# Patient Record
Sex: Male | Born: 1975
Health system: Southern US, Community
[De-identification: ages and names within clinical notes are randomized; demographics above are authoritative.]

## PROBLEM LIST (undated history)

## (undated) DIAGNOSIS — K649 Unspecified hemorrhoids: Secondary | ICD-10-CM

## (undated) DIAGNOSIS — K921 Melena: Secondary | ICD-10-CM

## (undated) HISTORY — DX: Melena: K92.1

## (undated) HISTORY — DX: Unspecified hemorrhoids: K64.9

## (undated) HISTORY — PX: OTHER SURGICAL HISTORY: SHX169

## (undated) HISTORY — PX: WISDOM TOOTH EXTRACTION: SHX21

---

## 2017-11-29 DIAGNOSIS — Z0000 Encounter for general adult medical examination without abnormal findings: Secondary | ICD-10-CM | POA: Diagnosis not present

## 2017-11-29 DIAGNOSIS — Z125 Encounter for screening for malignant neoplasm of prostate: Secondary | ICD-10-CM | POA: Diagnosis not present

## 2017-12-06 DIAGNOSIS — Z0000 Encounter for general adult medical examination without abnormal findings: Secondary | ICD-10-CM | POA: Diagnosis not present

## 2018-12-04 DIAGNOSIS — Z0000 Encounter for general adult medical examination without abnormal findings: Secondary | ICD-10-CM | POA: Diagnosis not present

## 2018-12-11 DIAGNOSIS — Z23 Encounter for immunization: Secondary | ICD-10-CM | POA: Diagnosis not present

## 2018-12-11 DIAGNOSIS — Z0000 Encounter for general adult medical examination without abnormal findings: Secondary | ICD-10-CM | POA: Diagnosis not present

## 2020-03-19 ENCOUNTER — Encounter: Payer: Self-pay | Admitting: Nurse Practitioner

## 2020-03-24 ENCOUNTER — Ambulatory Visit (INDEPENDENT_AMBULATORY_CARE_PROVIDER_SITE_OTHER): Payer: No Typology Code available for payment source | Admitting: Pharmacist

## 2020-03-24 ENCOUNTER — Other Ambulatory Visit: Payer: Self-pay

## 2020-03-24 ENCOUNTER — Telehealth: Payer: Self-pay | Admitting: Pharmacy Technician

## 2020-03-24 DIAGNOSIS — Z7252 High risk homosexual behavior: Secondary | ICD-10-CM | POA: Diagnosis not present

## 2020-03-24 NOTE — Telephone Encounter (Signed)
[facility] Patient Product/process development scientist completed.    The patient is insured through [employer]  and has a $40 copay.  We will continue to follow to see if copay assistance is needed.  Netty Starring. Dimas Aguas CPhT Specialty Pharmacy Patient [facility] Phone: [phone] Fax:  [phone]

## 2020-03-24 NOTE — Progress Notes (Signed)
Date:  03/24/2020   HPI: Nathan Torres is a 44 y.o. male who presents to the [facility] pharmacy clinic to discuss and initiate PrEP.  Insured   [x]    Uninsured  []    There are no problems to display for this patient.   Patient's Medications   No medications on file    Allergies: Not on File  Past Medical History: No past medical history on file.  Social History: Social History   Socioeconomic History   Marital status: Married    Spouse name: Not on file   Number of children: Not on file   Years of education: Not on file   Highest education level: Not on file  Occupational History   Not on file  Tobacco Use   Smoking status: Not on file  Substance and Sexual Activity   Alcohol use: Not on file   Drug use: Not on file   Sexual activity: Not on file  Other Topics Concern   Not on file  Social History Narrative   Not on file   Social Determinants of Health   Financial Resource Strain:    Difficulty of Paying Living Expenses:   Food Insecurity:    Worried About Running Out of Food in the Last Year:    of Food in the Last Year:   Transportation Needs:    (Medical):    Lack of Transportation (Non-Medical):   Physical Activity:    Days of Exercise per Week:    Minutes of Exercise per Session:   Stress:    Feeling of Stress :   Social Connections:    Frequency of Communication with Friends and Family:    Frequency of Social Gatherings with Friends and Family:    Attends Religious Services:    Active Member of Clubs or Organizations:    Attends Merchant navy officer Meetings:    Marital Status:     No flowsheet data found.  Labs:  SCr: No results found for: CREATININE HIV No results found for: HIV Hepatitis B No results found for: HEPBSAB, HEPBSAG, HEPBCAB Hepatitis C No results found for: HEPCAB, HCVRNAPCRQN Hepatitis A No results found for: HAV RPR and STI No results found for: LABRPR,  RPRTITER  No flowsheet data found.  Assessment: Nathan Torres is here today to discuss and initiate PrEP. He knows quite a bit at baseline regarding PrEP and tried to get his PrEP online through a service called [facility] but had issues with the pharmacy and doctor and never got his medication.  He is insured through [employer] and is an Dimas Aguas for a [place].   He has male partners and has had 3 in the last 3 months with about 75% condom use. He is mainly the insertive partner, but engages in oral and rectal sex occasionally. He had labs drawn back in June for the [facility] site and was HIV negative, Hepatitis B surface antigen negative, Hepatitis C negative, syphilis negative, but positive for rectal chlamydia. He states this was the first time he has ever been tested. He was appropriately treated with azithromycin.  His urethral and pharyngeal swabs were negative for gonorrhea and chlamydia. His kidney function was also normal on these labs at 1.06 (labs are located under the media tab). He is not sure if any of his partners are HIV positive. He has never used PEP, IV drugs, or stimulants. He is interested in starting Descovy.   Counseled patient that Descovy is a  one pill once daily regimen with or without food that can prevent HIV. Discussed the importance of taking the medication daily to provide protection and decreased adherence is associated with decreased efficacy. Also discussed how Descovy works to prevent HIV but not other STDs and encouraged the use of condoms. Counseled on what to do if dose is missed, if closer to missed dose take immediately, if closer to next dose then skip and resume normal schedule.  Counseled patient that Descovy is normally well tolerated, however some patients experience a "start up syndrome" with nausea, diarrhea, dizziness, and fatigue but that those should resolve soon after starting.  Advised that any nausea can be mitigated by taking it with food. I reviewed  patient medications and found no drug interactions. Discussed how our PrEP process works here at the clinic including follow ups and lab monitoring every 3 months.  Will check a few more labs today - HIV again, Hepatitis B surface antibody, and Hepatitis A antibody to see if he has immunity. Will start him on Descovy if he is HIV negative and see him back in 3 months.  Plan: - HIV antibody, Hepatitis B surf ab, Hepatitis A ab - Descovy x 3 months if HIV negative - F/u in 3 months  Nathan Torres Nathan Torres, PharmD, BCIDP, AAHIVP, CPP Clinical Pharmacist Practitioner Infectious Diseases Clinical Pharmacist [facility] 03/24/2020, 11:37 AM

## 2020-03-25 ENCOUNTER — Telehealth: Payer: Self-pay | Admitting: Pharmacist

## 2020-03-25 ENCOUNTER — Telehealth: Payer: Self-pay | Admitting: Pharmacy Technician

## 2020-03-25 DIAGNOSIS — Z7252 High risk homosexual behavior: Secondary | ICD-10-CM

## 2020-03-25 LAB — HEPATITIS B SURFACE ANTIBODY,QUALITATIVE: Hep B S Ab: NONREACTIVE

## 2020-03-25 LAB — HEPATITIS A ANTIBODY, TOTAL: Hepatitis A AB,Total: NONREACTIVE

## 2020-03-25 LAB — HIV ANTIBODY (ROUTINE TESTING W REFLEX): HIV 1&2 Ab, 4th Generation: NONREACTIVE

## 2020-03-25 MED ORDER — DESCOVY 200-25 MG PO TABS: 1.0000 | ORAL_TABLET | Freq: Every day | ORAL | 2 refills | Status: DC

## 2020-03-25 MED FILL — DESCOVY 200-25 MG TABS: 200-25 | 30 days supply | Qty: 30 | Fill #0

## 2020-03-25 NOTE — Telephone Encounter (Signed)
[facility] Patient Advocate Encounter   Was successful in obtaining a [employer] copay card for Descovy.  This copay card will make the patients copay $0.   The billing information is as follows and has been shared with [facility].   Netty Starring. Dimas Aguas CPhT Specialty Pharmacy Patient [facility] Phone: [phone] Fax:  [phone]

## 2020-03-25 NOTE — Telephone Encounter (Signed)
Patient's HIV antibody is negative.  Will send in 3 months of Descovy to [facility].  

## 2020-04-07 ENCOUNTER — Other Ambulatory Visit: Payer: Self-pay | Admitting: Pharmacist

## 2020-04-07 DIAGNOSIS — Z7252 High risk homosexual behavior: Secondary | ICD-10-CM

## 2020-04-07 MED ORDER — DESCOVY 200-25 MG PO TABS: 1.0000 | ORAL_TABLET | Freq: Every day | ORAL | 2 refills | Status: DC

## 2020-04-07 NOTE — Progress Notes (Signed)
Patient's insurance requires Descovy to be filled at [facility]. Resending Rx now. Kathie Rhodes will call patient and coordinate.

## 2020-05-05 ENCOUNTER — Encounter: Payer: Self-pay | Admitting: General Surgery

## 2020-05-06 ENCOUNTER — Ambulatory Visit: Payer: No Typology Code available for payment source | Admitting: Nurse Practitioner

## 2020-05-06 ENCOUNTER — Encounter: Payer: Self-pay | Admitting: Nurse Practitioner

## 2020-05-06 VITALS — BP 106/78 | HR 71 | Ht 66.0 in | Wt 185.0 lb

## 2020-05-06 DIAGNOSIS — K5900 Constipation, unspecified: Secondary | ICD-10-CM | POA: Diagnosis not present

## 2020-05-06 DIAGNOSIS — K625 Hemorrhage of anus and rectum: Secondary | ICD-10-CM | POA: Diagnosis not present

## 2020-05-06 MED ORDER — SUPREP BOWEL PREP KIT 17.5-3.13-1.6 GM/177ML PO SOLN
1.0000 | ORAL | 0 refills | Status: DC
Start: 2020-05-06 — End: 2020-06-27

## 2020-05-06 NOTE — Progress Notes (Signed)
ASSESSMENT AND PLAN    # Chronic intermittent rectal bleeding / rectal discomfort with BM in setting of hard stools --Known history of hemorrhoids. Bleeding probably hemorrhoidal. Fissure not appreciated on Surgery's rectal exam/anoscopy  --Symptoms resolving after addition of fiber. Continue daily Benefiber --Water intake goal is 48-64 oz daily --Patient needs colonoscopy for further evaluation of recent rectal bleeding. the risks and benefits of colonoscopy with possible polypectomy / biopsies were discussed and the patient agrees to proceed.   # GERD --Manifested as chest discomfort / nausea ~ once a week.  --Anti-reflux measures discussed --Continue Omeprazole OTC prn  HISTORY OF PRESENT ILLNESS     Primary Gastroenterologist : new ( Dr. Adela Lank) Chief Complaint : rectal bleeding  Nathan Torres is a 44 y.o. male with PMH / PSH significant for,  but not necessarily limited to GERD  Patient is referred by Marin Olp, MD for evaluation of rectal bleeding. PCP referred patient to Surgery for evaluation of a hemorrhoid. Patient has been having intermittent rectal bleeding for 6-7 months. He had some instances of rectal bleeding as a child no further problems until now. Patient is homosexual but hasn't changed any of his sexual practices recently. Blood is bright red, occurs with BMs. He typically has associated rectal pain with the bleeding.   [facility] records reviewed.  On rectal exam there patient had small decompressed external hemorrhoids, DRE was normal.  Anoscopy demonstrated no evidence of ulcerations or granulation tissue. There were small internal hemorrhoids seen. No fissures.   Prior to starting fiber patient's  stools tended to be hard. Now stools not as hard and the rectal pain has resolved. He hasn't hasn't had any bleeding in two weeks. He tries to drink 48 ounces water daily. No abdominal pain. No unusual weight loss. Appetite is fine. About once a week he  has postprandial nausea and chest discomfort. Symptoms are always relieved with Omeprazole.     Past Medical History:  Diagnosis Date  . Bloody stools    Per [facility] encounter 03/13/2020  . Hemorrhoids    Per [facility] encounter 03/13/2020     Past Surgical History:  Procedure Laterality Date  . vocal cords    . WISDOM TOOTH EXTRACTION     Family History  Problem Relation Age of Onset  . Heart disease Mother   . Hypertension Mother   . Thyroid disease Mother   . Hypertension Father   . Hypertension Brother   . Stomach cancer Neg Hx   . Colon cancer Neg Hx   . Esophageal cancer Neg Hx   . Pancreatic cancer Neg Hx    Social History   Tobacco Use  . Smoking status: Never Smoker  . Smokeless tobacco: Never Used  Vaping Use  . Vaping Use: Never used  Substance Use Topics  . Alcohol use: Yes    Comment: occasional  . Drug use: Never   Current Outpatient Medications  Medication Sig Dispense Refill  . Multiple Vitamin (MULTIVITAMIN) tablet Take 1 tablet by mouth daily.    . Wheat Dextrin (BENEFIBER PO) Take by mouth.    Marland Kitchen emtricitabine-tenofovir AF (DESCOVY) 200-25 MG tablet Take 1 tablet by mouth daily. 30 tablet 2   No current facility-administered medications for this visit.   No Known Allergies   Review of Systems:  All systems reviewed and negative except where noted in HPI.   PHYSICAL EXAM :    Wt Readings from Last 3 Encounters:  05/06/20 185 lb (83.9  kg)    BP 106/78   Pulse 71   Ht 5\' 6"  (1.676 m)   Wt 185 lb (83.9 kg)   SpO2 99%   BMI 29.86 kg/m  Constitutional:  Pleasant male in no acute distress. Psychiatric: Normal mood and affect. Behavior is normal. EENT: Pupils normal.  Conjunctivae are normal. No scleral icterus. Neck supple.  Cardiovascular: Normal rate, regular rhythm. No edema Pulmonary/chest: Effort normal and breath sounds normal. No wheezing, rales or rhonchi. Abdominal: Soft, nondistended, nontender. Bowel sounds active throughout. There  are no masses palpable. No hepatomegaly. Neurological: Alert and oriented to person place and time. Skin: Skin is warm and dry. No rashes noted.  , NP  05/06/2020, 8:38 AM  Cc:  Referring Provider 05/08/2020, MD

## 2020-05-06 NOTE — Patient Instructions (Signed)
If you are age 46 or older, your body mass index should be between 23-30. Your Body mass index is 29.86 kg/m. If this is out of the aforementioned range listed, please consider follow up with your Primary Care Provider.  If you are age 63 or younger, your body mass index should be between 19-25. Your Body mass index is 29.86 kg/m. If this is out of the aformentioned range listed, please consider follow up with your Primary Care Provider.    We have sent the following medications to your pharmacy for you to pick up at your convenience: Suprep  Due to recent changes in healthcare laws, you may see the results of your imaging and laboratory studies on MyChart before your provider has had a chance to review them.  We understand that in some cases there may be results that are confusing or concerning to you. Not all laboratory results come back in the same time frame and the provider may be waiting for multiple results in order to interpret others.  Please give [place] 48 hours in order for your provider to thoroughly review all the results before contacting the office for clarification of your results.

## 2020-05-07 NOTE — Progress Notes (Signed)
Agree with assessment and plan as outlined.  

## 2020-05-12 ENCOUNTER — Telehealth: Payer: Self-pay | Admitting: Nurse Practitioner

## 2020-05-12 MED ORDER — SUTAB 1479-225-188 MG PO TABS: 1.0000 | ORAL_TABLET | Freq: Once | ORAL | 0 refills | Status: AC

## 2020-05-12 MED FILL — SUTAB 1479-225-188 MG TABS: 1479-225-18 | 2 days supply | Qty: 24 | Fill #0

## 2020-05-12 NOTE — Telephone Encounter (Signed)
Contacted [facility] [employer] and spoke with Arlys John, he stated that suprep did need a letter of medical necessity or that his insurance will pay for Clenpiq or Peg [id number]. Dr Adela Lank would rather the patient have Sutab per Jan so send in the [place] and code for the patient.

## 2020-05-12 NOTE — Telephone Encounter (Signed)
Patient called states Suprep requires prior auth please advise

## 2020-05-12 NOTE — Telephone Encounter (Signed)
Sent new rx to [facility] for [employer]. Sent Sutab instructions to patient via [employer]

## 2020-06-02 NOTE — Progress Notes (Signed)
Date:  06/02/2020   HPI: Nathan Torres is a 44 y.o. male who presents to the [place] clinic for HIV PrEP follow-up.  Insured   '[x]'    Uninsured  '[]'    There are no problems to display for this patient.  Patient's Medications  New Prescriptions   No medications on file  Previous Medications   EMTRICITABINE-TENOFOVIR AF (DESCOVY) 200-25 MG TABLET    Take 1 tablet by mouth daily.   MULTIPLE VITAMIN (MULTIVITAMIN) TABLET    Take 1 tablet by mouth daily.   SUPREP BOWEL PREP KIT 17.5-3.13-1.6 GM/177ML SOLN    Take 1 kit by mouth as directed. For colonoscopy prep   WHEAT DEXTRIN (BENEFIBER PO)    Take by mouth.  Modified Medications   No medications on file  Discontinued Medications   No medications on file    Allergies: No Known Allergies  Past Medical History: Past Medical History:  Diagnosis Date  . Bloody stools    Per [facility] encounter 03/13/2020  . Hemorrhoids    Per [facility] encounter 03/13/2020    Social History: Social History   Socioeconomic History  . Marital status: Unknown    Spouse name: Not on file  . Number of children: Not on file  . Years of education: Not on file  . Highest education level: Not on file  Occupational History  . Occupation: life Chief Executive Officer: [employer]  Tobacco Use  . Smoking status: Never Smoker  . Smokeless tobacco: Never Used  Vaping Use  . Vaping Use: Never used  Substance and Sexual Activity  . Alcohol use: Yes    Comment: occasional  . Drug use: Never  . Sexual activity: Not on file  Other Topics Concern  . Not on file  Social History Narrative  . Not on file   Social Determinants of Health   Financial Resource Strain:   . Difficulty of Paying Living Expenses: Not on file  Food Insecurity:   . Worried About Charity fundraiser in the Last Year: Not on file  . Ran Out of Food in the Last Year: Not on file  Transportation Needs:   . Lack of Transportation (Medical): Not on file  .  Lack of Transportation (Non-Medical): Not on file  Physical Activity:   . Days of Exercise per Week: Not on file  . Minutes of Exercise per Session: Not on file  Stress:   . Feeling of Stress : Not on file  Social Connections:   . Frequency of Communication with Friends and Family: Not on file  . Frequency of Social Gatherings with Friends and Family: Not on file  . Attends Religious Services: Not on file  . Active Member of Clubs or Organizations: Not on file  . Attends Archivist Meetings: Not on file  . Marital Status: Not on file    CHL HIV PREP FLOWSHEET RESULTS 03/24/2020  Insurance Status Insured  Gender at birth Male  Gender identity cis-Male  Risk for HIV Hx of STI  Sex Partners Men only  # sex partners past 3-6 mos 3  Sex activity preferences Insertive and receptive;Insertive;Oral  Condom use Yes  % condom use 75  Treated for STI? Yes  HIV symptoms? None    Labs:  SCr: No results found for: CREATININE HIV Lab Results  Component Value Date   HIV NON-REACTIVE 03/24/2020   Hepatitis B Lab Results  Component Value Date   HEPBSAB NON-REACTIVE 03/24/2020  Hepatitis C No results found for: HEPCAB, HCVRNAPCRQN Hepatitis A Lab Results  Component Value Date   HAV NON-REACTIVE 03/24/2020   RPR and STI No results found for: LABRPR, RPRTITER  No flowsheet data found.  Assessment: [name] presents today in good spirits for his 42-monthPrEP follow-up visit after initiating PrEP at last visit. He reports no missed doses and no side effects with Descovy. [facility]. Denies signs and symptoms of acute HIV and STDs. Reports same 3 partners and uses condoms with 2 of the 3 partners. Denies any new medications. Will check HIV antibody and STDs today.   Plan: - Labs: HIV antibody, RPR, gonorrhea/chlamydia (pharyngeal, rectal, and urine specimens) - Descovy x 3 months if HIV negative - F/u in 3 months for PrEP visit  on 09/01/20 at 11:30AM  ILorel Monaco PharmD PGY2 Ambulatory Care Resident [place]

## 2020-06-09 ENCOUNTER — Other Ambulatory Visit (HOSPITAL_COMMUNITY)
Admission: RE | Admit: 2020-06-09 | Discharge: 2020-06-09 | Disposition: A | Discharge status: Home / Self Care | Payer: No Typology Code available for payment source | Source: Ambulatory Visit | Attending: Infectious Disease | Admitting: Infectious Disease

## 2020-06-09 ENCOUNTER — Ambulatory Visit (INDEPENDENT_AMBULATORY_CARE_PROVIDER_SITE_OTHER): Payer: No Typology Code available for payment source | Admitting: Pharmacist

## 2020-06-09 ENCOUNTER — Other Ambulatory Visit: Payer: Self-pay

## 2020-06-09 DIAGNOSIS — Z7252 High risk homosexual behavior: Secondary | ICD-10-CM

## 2020-06-09 DIAGNOSIS — Z113 Encounter for screening for infections with a predominantly sexual mode of transmission: Secondary | ICD-10-CM

## 2020-06-10 ENCOUNTER — Other Ambulatory Visit: Payer: Self-pay | Admitting: Pharmacist

## 2020-06-10 DIAGNOSIS — Z7252 High risk homosexual behavior: Secondary | ICD-10-CM

## 2020-06-10 LAB — CYTOLOGY, (ORAL, ANAL, URETHRAL) ANCILLARY ONLY
Chlamydia: NEGATIVE
Chlamydia: NEGATIVE
Comment: NEGATIVE
Comment: NEGATIVE
Comment: NORMAL
Comment: NORMAL
Neisseria Gonorrhea: NEGATIVE
Neisseria Gonorrhea: NEGATIVE

## 2020-06-10 LAB — HIV ANTIBODY (ROUTINE TESTING W REFLEX): HIV 1&2 Ab, 4th Generation: NONREACTIVE

## 2020-06-10 LAB — URINE CYTOLOGY ANCILLARY ONLY
Chlamydia: NEGATIVE
Comment: NEGATIVE
Comment: NORMAL
Neisseria Gonorrhea: NEGATIVE

## 2020-06-10 LAB — RPR: RPR Ser Ql: NONREACTIVE

## 2020-06-10 MED ORDER — DESCOVY 200-25 MG PO TABS: 1.0000 | ORAL_TABLET | Freq: Every day | ORAL | 2 refills | Status: DC

## 2020-06-10 NOTE — Progress Notes (Signed)
Patient's HIV antibody is negative.  Will [facility] in [facility] more months of [facility].  

## 2020-06-27 ENCOUNTER — Ambulatory Visit (AMBULATORY_SURGERY_CENTER): Payer: No Typology Code available for payment source | Admitting: Gastroenterology

## 2020-06-27 ENCOUNTER — Encounter: Payer: Self-pay | Admitting: Gastroenterology

## 2020-06-27 ENCOUNTER — Other Ambulatory Visit: Payer: Self-pay

## 2020-06-27 VITALS — BP 106/66 | HR 59 | Temp 97.3°F | Resp 12 | Ht 66.0 in | Wt 185.0 lb

## 2020-06-27 DIAGNOSIS — K602 Anal fissure, unspecified: Secondary | ICD-10-CM | POA: Diagnosis not present

## 2020-06-27 DIAGNOSIS — K625 Hemorrhage of anus and rectum: Secondary | ICD-10-CM

## 2020-06-27 DIAGNOSIS — K648 Other hemorrhoids: Secondary | ICD-10-CM | POA: Diagnosis not present

## 2020-06-27 DIAGNOSIS — K6289 Other specified diseases of anus and rectum: Secondary | ICD-10-CM

## 2020-06-27 MED ORDER — SODIUM CHLORIDE 0.9 % IV SOLN: 500.0000 mL | Freq: Once | INTRAVENOUS | Status: DC

## 2020-06-27 NOTE — Patient Instructions (Signed)
Please read handouts provided. Continue present medications. Continue fiber supplements. Await pathology results. Call GI office if wanting topical nitroglcerin 0.125% ordered.     YOU HAD AN ENDOSCOPIC PROCEDURE TODAY AT THE [facility]:   Refer to the procedure report that was given to you for any specific questions about what was found during the examination.  If the procedure report does not answer your questions, please call your gastroenterologist to clarify.  If you requested that your care partner not be given the details of your procedure findings, then the procedure report has been included in a sealed envelope for you to review at your convenience later.  YOU SHOULD EXPECT: Some feelings of bloating in the abdomen. Passage of more gas than usual.  Walking can help get rid of the air that was put into your GI tract during the procedure and reduce the bloating. If you had a lower endoscopy (such as a colonoscopy or flexible sigmoidoscopy) you may notice spotting of blood in your stool or on the toilet paper. If you underwent a bowel prep for your procedure, you may not have a normal bowel movement for a few days.  Please Note:  You might notice some irritation and congestion in your nose or some drainage.  This is from the oxygen used during your procedure.  There is no need for concern and it should clear up in a day or so.  SYMPTOMS TO REPORT IMMEDIATELY:   Following lower endoscopy (colonoscopy or flexible sigmoidoscopy):  Excessive amounts of blood in the stool  Significant tenderness or worsening of abdominal pains  Swelling of the abdomen that is new, acute  Fever of 100F or higher   For urgent or emergent issues, a gastroenterologist can be reached at any hour by calling [phone]. Do not use MyChart messaging for urgent concerns.    DIET:  We do recommend a small meal at first, but then you may proceed to your regular diet.  Drink plenty of fluids but  you should avoid alcoholic beverages for 24 hours.  ACTIVITY:  You should plan to take it easy for the rest of today and you should NOT DRIVE or use heavy machinery until tomorrow (because of the sedation medicines used during the test).    FOLLOW UP: Our staff will call the number listed on your records 48-72 hours following your procedure to check on you and address any questions or concerns that you may have regarding the information given to you following your procedure. If we do not reach you, we will leave a message.  We will attempt to reach you two times.  During this call, we will ask if you have developed any symptoms of COVID 19. If you develop any symptoms (ie: fever, flu-like symptoms, shortness of breath, cough etc.) before then, please call [phone].  If you test positive for Covid 19 in the 2 weeks post procedure, please call and report this information to [place].    If any biopsies were taken you will be contacted by phone or by letter within the next 1-3 weeks.  Please call us at [phone] if you have not heard about the biopsies in 3 weeks.    SIGNATURES/CONFIDENTIALITY: You and/or your care partner have signed paperwork which will be entered into your electronic medical record.  These signatures attest to the fact that that the information above on your After Visit Summary has been reviewed and is understood.  Full responsibility of the confidentiality of this  discharge information lies with you and/or your care-partner. 

## 2020-06-27 NOTE — Progress Notes (Signed)
To PACU, VSS. Report to [email or web address] 

## 2020-06-27 NOTE — Progress Notes (Signed)
VS taken by C.W. 

## 2020-06-27 NOTE — Op Note (Signed)
[facility] Patient Name: Nathan Torres Procedure Date: 06/27/2020 10:39 AM MRN: [id number] Endoscopist: Viviann Spare P. Adela Lank , MD Age: 44 Referring MD:  Date of Birth: 10/21/1975 Gender: Male Account #: [id number] Procedure:                Colonoscopy Indications:              history of rectal bleeding - improved with daily                            fiber supplement Medicines:                Monitored Anesthesia Care Procedure:                Pre-Anesthesia Assessment:                           - Prior to the procedure, a History and Physical                            was performed, and patient medications and                            allergies were reviewed. The patient's tolerance of                            previous anesthesia was also reviewed. The risks                            and benefits of the procedure and the sedation                            options and risks were discussed with the patient.                            All questions were answered, and informed consent                            was obtained. Prior Anticoagulants: The patient has                            taken no previous anticoagulant or antiplatelet                            agents. ASA Grade Assessment: II - A patient with                            mild systemic disease. After reviewing the risks                            and benefits, the patient was deemed in                            satisfactory condition to undergo the procedure.  After obtaining informed consent, the colonoscope                            was passed under direct vision. Throughout the                            procedure, the patient's blood pressure, pulse, and                            oxygen saturations were monitored continuously. The                            Colonoscope was introduced through the anus and                            advanced to the the terminal ileum,  with                            identification of the appendiceal orifice and IC                            valve. The colonoscopy was performed without                            difficulty. The patient tolerated the procedure                            well. The quality of the bowel preparation was                            good. The terminal ileum, ileocecal valve,                            appendiceal orifice, and rectum were photographed. Scope In: 10:51:25 AM Scope Out: 11:05:24 AM Scope Withdrawal Time: 0 hours 11 minutes 58 seconds  Total Procedure Duration: 0 hours 13 minutes 59 seconds  Findings:                 A small anal fissure was found on perianal exam,                            posterior midline.                           The terminal ileum appeared normal.                           A diminutive polypoid lesion a few mm in size was                            found at the dentate line / proximal anal canal.                            The lesion was sessile. Biopsies were taken with a  cold forceps for histology, rule out AIN /                            condyloma.                           Internal hemorrhoids were found during                            retroflexion. The hemorrhoids were small.                           The exam was otherwise without abnormality. Complications:            No immediate complications. Estimated blood loss:                            Minimal. Estimated Blood Loss:     Estimated blood loss was minimal. Impression:               - Small anal fissure found on perianal exam.                           - The examined portion of the ileum was normal.                           - Dimunitive polypoid lesion at the dentate line /                            proximal anal canal as outlined. Biopsied.                           - Internal hemorrhoids.                           - The examination was otherwise normal.                            Suspect symptoms more than likely due to fissure /                            hemorrhoids, improved with fiber supplementation Recommendation:           - Patient has a contact number available for                            emergencies. The signs and symptoms of potential                            delayed complications were discussed with the                            patient. Return to normal activities tomorrow.                            Written discharge instructions were provided to the  patient.                           - Resume previous diet.                           - Continue present medications.                           - Continue fiber supplement                           - Add tropical nitroglycerin 0.125% - pea sized                            amount PR three times daily as needed for fissure                           - Await pathology results. Viviann Spare P. Kimyetta Flott, MD 06/27/2020 11:11:39 AM This report has been signed electronically.

## 2020-06-27 NOTE — Progress Notes (Signed)
Called to room to assist during endoscopic procedure.  Patient ID and intended procedure confirmed with present staff. Received instructions for my participation in the procedure from the performing physician.  

## 2020-06-30 ENCOUNTER — Telehealth: Payer: Self-pay

## 2020-06-30 NOTE — Telephone Encounter (Signed)
Per 06/27/20 procedure note, called in topical nitroglycerin ointment 0.125% to [facility]. They will contact patient.

## 2020-07-01 ENCOUNTER — Telehealth: Payer: Self-pay | Admitting: *Deleted

## 2020-07-01 ENCOUNTER — Telehealth: Payer: Self-pay

## 2020-07-01 NOTE — Telephone Encounter (Signed)
  Follow up Call-  Call back number 06/27/2020  Post procedure Call Back phone  # [phone]  Permission to leave phone message Yes     Patient questions:  Do you have a fever, pain , or abdominal swelling? No. Pain Score  0  Have you tolerated food without any problems? Yes.    Have you been able to return to your normal activities? Yes.    Do you have any questions about your discharge instructions: Diet   No. Medications  No. Follow up visit  No.  Do you have questions or concerns about your Care? No.  Actions: * If pain score is 4 or above: No action needed, pain <4. 1. Have you developed a fever since your procedure? no  2.   Have you had an respiratory symptoms (SOB or cough) since your procedure no   3.   Have you tested positive for COVID 19 since your procedure no  4.   Have you had any family members/close contacts diagnosed with the COVID 19 since your procedure?  no   If yes to any of these questions please route to Laverna Peace, RN and Karlton Lemon, RN

## 2020-07-01 NOTE — Telephone Encounter (Signed)
No answer, left message to call back later today, B.Yuridiana Formanek RN. 

## 2020-07-04 NOTE — Telephone Encounter (Signed)
Dr. Adela Lank, just an FYI. Thank you!

## 2020-08-25 ENCOUNTER — Other Ambulatory Visit: Payer: Self-pay

## 2020-08-25 ENCOUNTER — Other Ambulatory Visit (HOSPITAL_COMMUNITY)
Admission: RE | Admit: 2020-08-25 | Discharge: 2020-08-25 | Disposition: A | Discharge status: Home / Self Care | Payer: No Typology Code available for payment source | Source: Ambulatory Visit | Attending: Infectious Disease | Admitting: Infectious Disease

## 2020-08-25 ENCOUNTER — Ambulatory Visit (INDEPENDENT_AMBULATORY_CARE_PROVIDER_SITE_OTHER): Payer: No Typology Code available for payment source | Admitting: Pharmacist

## 2020-08-25 DIAGNOSIS — Z79899 Other long term (current) drug therapy: Secondary | ICD-10-CM | POA: Diagnosis not present

## 2020-08-25 DIAGNOSIS — Z113 Encounter for screening for infections with a predominantly sexual mode of transmission: Secondary | ICD-10-CM | POA: Diagnosis not present

## 2020-08-25 DIAGNOSIS — Z7252 High risk homosexual behavior: Secondary | ICD-10-CM

## 2020-08-25 NOTE — Patient Instructions (Signed)
[facility] Department

## 2020-08-25 NOTE — Progress Notes (Signed)
Date:  08/25/2020   HPI: Nathan Torres is a 45 y.o. male who presents to the [facility] pharmacy clinic for HIV PrEP follow-up.  Insured   [x]    Uninsured  []    Patient Active Problem List   Diagnosis Date Noted  . High risk homosexual behavior 06/10/2020    Patient's Medications  New Prescriptions   No medications on file  Previous Medications   EMTRICITABINE-TENOFOVIR AF (DESCOVY) 200-25 MG TABLET    Take 1 tablet by mouth daily.   MULTIPLE VITAMIN (MULTIVITAMIN) TABLET    Take 1 tablet by mouth daily.   WHEAT DEXTRIN (BENEFIBER PO)    Take by mouth.  Modified Medications   No medications on file  Discontinued Medications   No medications on file    Allergies: No Known Allergies  Past Medical History: Past Medical History:  Diagnosis Date  . Bloody stools    Per [facility] encounter 03/13/2020  . Hemorrhoids    Per [facility] encounter 03/13/2020    Social History: Social History   Socioeconomic History  . Marital status: Unknown    Spouse name: Not on file  . Number of children: Not on file  . Years of education: Not on file  . Highest education level: Not on file  Occupational History  . Occupation: life 03/15/2020: [employer]  Tobacco Use  . Smoking status: Never Smoker  . Smokeless tobacco: Never Used  Vaping Use  . Vaping Use: Never used  Substance and Sexual Activity  . Alcohol use: Yes    Comment: occasional  . Drug use: Never  . Sexual activity: Not on file  Other Topics Concern  . Not on file  Social History Narrative  . Not on file   Social Determinants of Health   Financial Resource Strain: Not on file  Food Insecurity: Not on file  Transportation Needs: Not on file  Physical Activity: Not on file  Stress: Not on file  Social Connections: Not on file    CHL HIV PREP FLOWSHEET RESULTS 03/24/2020  Insurance Status Insured  Gender at birth Male  Gender identity cis-Male  Risk for HIV Hx of STI  Sex Partners Men  only  # sex partners past 3-6 mos 3  Sex activity preferences Insertive and receptive;Insertive;Oral  Condom use Yes  % condom use 75  Treated for STI? Yes  HIV symptoms? None    Labs:  SCr: No results found for: CREATININE HIV Lab Results  Component Value Date   HIV NON-REACTIVE 06/09/2020   HIV NON-REACTIVE 03/24/2020   Hepatitis B Lab Results  Component Value Date   HEPBSAB NON-REACTIVE 03/24/2020   Hepatitis C No results found for: HEPCAB, HCVRNAPCRQN Hepatitis A Lab Results  Component Value Date   HAV NON-REACTIVE 03/24/2020   RPR and STI Lab Results  Component Value Date   LABRPR NON-REACTIVE 06/09/2020    STI Results GC CT  06/09/2020 Negative Negative  06/09/2020 Negative Negative  06/09/2020 Negative Negative    Assessment: Nathan Torres is here today to follow up for PrEP. He takes Descovy daily and has no issues with side effects or missed doses. He is wondering if he needs PrEP going forward as he and his husband have decided to be monogamous. His last sexual encounter outside of his husband was on 12/21. He is asking if he needs to wean off or if he can stop suddenly. I told him that he can stop suddenly and does not need to  taper his dosing. I will do labs today (he is requesting a viral load as well to be sure) and told him to call if he wants to start PrEP again in the future. Also gave him free testing locations if he wanted to get testing going forward as we do not do STI testing without an appointment here.  Plan: - Stop PrEP - HIV antibody, HIV viral load, BMET, RPR, urine/oral/rectal gonorrhea/chlamydia cytology today - RTC if needed  Jade Burkard L. Loraina Stauffer, PharmD, BCIDP, AAHIVP, CPP Clinical Pharmacist Practitioner Infectious Diseases Clinical Pharmacist [facility] 08/25/2020, 1:58 PM

## 2020-08-26 ENCOUNTER — Encounter: Payer: Self-pay | Admitting: Pharmacist

## 2020-08-26 LAB — CYTOLOGY, (ORAL, ANAL, URETHRAL) ANCILLARY ONLY
Chlamydia: NEGATIVE
Chlamydia: NEGATIVE
Comment: NEGATIVE
Comment: NEGATIVE
Comment: NORMAL
Comment: NORMAL
Neisseria Gonorrhea: NEGATIVE
Neisseria Gonorrhea: NEGATIVE

## 2020-08-26 LAB — URINE CYTOLOGY ANCILLARY ONLY
Chlamydia: NEGATIVE
Comment: NEGATIVE
Comment: NORMAL
Neisseria Gonorrhea: NEGATIVE

## 2020-08-31 LAB — BASIC METABOLIC PANEL
BUN: 12 mg/dL (ref 7–25)
CO2: 26 mmol/L (ref 20–32)
Calcium: 9.8 mg/dL (ref 8.6–10.3)
Chloride: 106 mmol/L (ref 98–110)
Creat: 0.98 mg/dL (ref 0.60–1.35)
Glucose, Bld: 91 mg/dL (ref 65–99)
Potassium: 4.5 mmol/L (ref 3.5–5.3)
Sodium: 141 mmol/L (ref 135–146)

## 2020-08-31 LAB — HIV-1 RNA QUANT-NO REFLEX-BLD
HIV 1 RNA Quant: <20 Copies/mL
HIV-1 RNA Quant, Log: <1.3 Log cps/mL

## 2020-08-31 LAB — RPR: RPR Ser Ql: NONREACTIVE

## 2020-08-31 LAB — HIV ANTIBODY (ROUTINE TESTING W REFLEX): HIV 1&2 Ab, 4th Generation: NONREACTIVE

## 2020-09-01 ENCOUNTER — Encounter: Payer: Self-pay | Admitting: Pharmacist

## 2020-09-01 ENCOUNTER — Ambulatory Visit: Payer: No Typology Code available for payment source | Admitting: Pharmacist

## 2020-09-19 ENCOUNTER — Telehealth: Payer: Self-pay

## 2020-09-19 ENCOUNTER — Other Ambulatory Visit: Payer: Self-pay | Admitting: Pharmacist

## 2020-09-19 DIAGNOSIS — Z7252 High risk homosexual behavior: Secondary | ICD-10-CM

## 2020-09-19 NOTE — Telephone Encounter (Signed)
Received refill request for Descovy. Patient was previously taking medication for prep then decided to stop per PharmD previous encounter. Patient now wanting to restart medication. Patient will follow up in March with Dr. Earlene Plater. Routing to provider if refill is appropriate.  Valarie Cones

## 2020-09-22 MED ORDER — EMTRICITABINE-TENOFOVIR AF 200-25 MG PO TABS: 1.0000 | ORAL_TABLET | Freq: Every day | ORAL | 2 refills | Status: DC

## 2020-09-22 NOTE — Addendum Note (Signed)
Addended by: Kathlynn Grate on: 09/22/2020 03:39 PM   Modules accepted: Orders

## 2020-09-22 NOTE — Telephone Encounter (Signed)
Patient returning a call from our office. RN advised him that staff was just wanting to let him know that a refill for his Descovy has been sent to [facility]. Patient verbalized understanding and has no further questions.   Sandie Ano, RN

## 2020-10-23 ENCOUNTER — Encounter: Payer: Self-pay | Admitting: Internal Medicine

## 2020-10-23 ENCOUNTER — Other Ambulatory Visit (HOSPITAL_COMMUNITY)
Admission: RE | Admit: 2020-10-23 | Discharge: 2020-10-23 | Disposition: A | Discharge status: Home / Self Care | Payer: No Typology Code available for payment source | Source: Ambulatory Visit | Attending: Internal Medicine | Admitting: Internal Medicine

## 2020-10-23 ENCOUNTER — Ambulatory Visit (INDEPENDENT_AMBULATORY_CARE_PROVIDER_SITE_OTHER): Payer: No Typology Code available for payment source | Admitting: Internal Medicine

## 2020-10-23 ENCOUNTER — Other Ambulatory Visit: Payer: Self-pay

## 2020-10-23 VITALS — BP 127/95 | HR 59 | Temp 97.7°F | Ht 66.0 in | Wt 190.0 lb

## 2020-10-23 DIAGNOSIS — Z7252 High risk homosexual behavior: Secondary | ICD-10-CM

## 2020-10-23 NOTE — Progress Notes (Signed)
[facility]  CHIEF COMPLAINT:    Follow up for PrEP  SUBJECTIVE:    Nathan Torres is a 45 y.o. male with PMHx as below who presents to the clinic for PrEP.   Patient has been followed in our clinic by pharmacy for HIV PrEP.  He was most recently seen on 08/25/2020.  At that visit he reported he was planning to stop his medication as he and his husband have decided to be monogamous.  However, he subsequently changed his mind and continued taking his Descovy after a 1 week hiatus.  Labs in January were negative for gonorrhea/chlamydia x3, HIV antibody, HIV viral load, RPR.  Basic metabolic panel was also normal.  He has been sexually active with his husband and 1 other partner during the past 3 months.  He has no concerns today and has otherwise been doing well.   Please see A&P for the details of today's visit and status of the patient's medical problems.   Patient's Medications  New Prescriptions   No medications on file  Previous Medications   EMTRICITABINE-TENOFOVIR AF (DESCOVY) 200-25 MG TABLET    Take 1 tablet by mouth daily.   MULTIPLE VITAMIN (MULTIVITAMIN) TABLET    Take 1 tablet by mouth daily.   WHEAT DEXTRIN (BENEFIBER PO)    Take by mouth.  Modified Medications   No medications on file  Discontinued Medications   No medications on file      Past Medical History:  Diagnosis Date  . Bloody stools    Per [facility] encounter 03/13/2020  . Hemorrhoids    Per [facility] encounter 03/13/2020    Social History   Tobacco Use  . Smoking status: Never Smoker  . Smokeless tobacco: Never Used  Vaping Use  . Vaping Use: Never used  Substance Use Topics  . Alcohol use: Yes    Comment: occasional  . Drug use: Never    Family History  Problem Relation Age of Onset  . Heart disease Mother   . Hypertension Mother   . Thyroid disease Mother   . Hypertension Father   . Hypertension Brother   . Stomach cancer Neg Hx   . Colon cancer Neg Hx    . Esophageal cancer Neg Hx   . Pancreatic cancer Neg Hx   . Rectal cancer Neg Hx     No Known Allergies  Review of Systems  Constitutional: Negative.   Gastrointestinal: Negative.   Genitourinary: Negative.      OBJECTIVE:    Vitals:   10/23/20 1402  BP: (!) 127/95  Pulse: (!) 59  Temp: 97.7 F (36.5 C)  TempSrc: Oral  SpO2: 98%  Weight: 190 lb (86.2 kg)  Height: 5\' 6"  (1.676 m)   Body mass index is 30.67 kg/m.  Physical Exam Constitutional:      General: He is not in acute distress.    Appearance: Normal appearance.  Pulmonary:     Effort: Pulmonary effort is normal. No respiratory distress.  Neurological:     General: No focal deficit present.     Mental Status: He is alert and oriented to person, place, and time.  Psychiatric:        Mood and Affect: Mood normal.        Behavior: Behavior normal.      Labs and Microbiology: No flowsheet data found. CMP Latest Ref Rng & Units 08/25/2020  Glucose 65 - 99 mg/dL 91  BUN  7 - 25 mg/dL 12  Creatinine 6.43 - 3.29 mg/dL 5.18  Sodium 841 - 660 mmol/L 141  Potassium 3.5 - 5.3 mmol/L 4.5  Chloride 98 - 110 mmol/L 106  CO2 20 - 32 mmol/L 26  Calcium 8.6 - 10.3 mg/dL 9.8        ASSESSMENT & PLAN:    High risk homosexual behavior Will check HIV labs, RPR, GC/CT urine, rectum, pharynx today.  Also, will screen for HCV and HBV (previous surface Ab negative; no surface Ag, core Ab on file).  Will have him continue Descovy assuming all labs look okay.  RTC 3 months.    Orders Placed This Encounter  Procedures  . Hepatitis B surface antibody  . Hepatitis B surface antigen  . HIV antibody  . Basic metabolic panel  . RPR  . Hepatitis C antibody  . Hepatitis B core antibody, total       Lady Deutscher [facility] 10/23/2020, 2:28 PM

## 2020-10-23 NOTE — Patient Instructions (Signed)
Thank you for coming to see me today. It was a pleasure seeing you.  To Do: Marland Kitchen Labs and swabs today . Continue daily Descovy . Follow up in 3 months  If you have any questions or concerns, please do not hesitate to call the office at [phone].  Take Care,   Gwynn Burly, DO

## 2020-10-23 NOTE — Assessment & Plan Note (Signed)
Will check HIV labs, RPR, GC/CT urine, rectum, pharynx today.  Also, will screen for HCV and HBV (previous surface Ab negative; no surface Ag, core Ab on file).  Will have him continue Descovy assuming all labs look okay.  RTC 3 months.

## 2020-10-24 LAB — URINE CYTOLOGY ANCILLARY ONLY
Chlamydia: NEGATIVE
Comment: NEGATIVE
Comment: NORMAL
Neisseria Gonorrhea: NEGATIVE

## 2020-10-24 LAB — HEPATITIS B SURFACE ANTIBODY,QUALITATIVE: Hep B S Ab: NONREACTIVE

## 2020-10-24 LAB — HEPATITIS C ANTIBODY
Hepatitis C Ab: NONREACTIVE
SIGNAL TO CUT-OFF: 0.02 (ref <1.00)

## 2020-10-24 LAB — CYTOLOGY, (ORAL, ANAL, URETHRAL) ANCILLARY ONLY
Chlamydia: NEGATIVE
Chlamydia: NEGATIVE
Comment: NEGATIVE
Comment: NEGATIVE
Comment: NORMAL
Comment: NORMAL
Neisseria Gonorrhea: NEGATIVE
Neisseria Gonorrhea: NEGATIVE

## 2020-10-24 LAB — BASIC METABOLIC PANEL
BUN: 20 mg/dL (ref 7–25)
CO2: 29 mmol/L (ref 20–32)
Calcium: 10.1 mg/dL (ref 8.6–10.3)
Chloride: 102 mmol/L (ref 98–110)
Creat: 0.98 mg/dL (ref 0.60–1.35)
Glucose, Bld: 87 mg/dL (ref 65–99)
Potassium: 4.1 mmol/L (ref 3.5–5.3)
Sodium: 140 mmol/L (ref 135–146)

## 2020-10-24 LAB — HEPATITIS B SURFACE ANTIGEN: Hepatitis B Surface Ag: NONREACTIVE

## 2020-10-24 LAB — HIV ANTIBODY (ROUTINE TESTING W REFLEX): HIV 1&2 Ab, 4th Generation: NONREACTIVE

## 2020-10-24 LAB — RPR: RPR Ser Ql: NONREACTIVE

## 2020-10-24 LAB — HEPATITIS B CORE ANTIBODY, TOTAL: Hep B Core Total Ab: NONREACTIVE

## 2020-10-29 ENCOUNTER — Telehealth: Payer: Self-pay

## 2020-10-29 NOTE — Telephone Encounter (Signed)
-----   Message from Kathlynn Grate, DO sent at 10/29/2020  7:23 AM EDT ----- Please let patient know that all labs were normal.  Okay to continue PrEP and follow up with myself or Cassie in 3 months.  Thanks, Greig Castilla

## 2020-10-29 NOTE — Telephone Encounter (Signed)
RN spoke with patient to let him know per Dr. Earlene Plater all of his labs were normal and that he should continue to take his PrEP and follow up in 3 months. Patient verbalized understanding and has no further questions.   Sandie Ano, RN

## 2020-12-26 ENCOUNTER — Other Ambulatory Visit: Payer: Self-pay | Admitting: Internal Medicine

## 2021-01-23 ENCOUNTER — Other Ambulatory Visit (HOSPITAL_COMMUNITY)
Admission: RE | Admit: 2021-01-23 | Discharge: 2021-01-23 | Disposition: A | Discharge status: Home / Self Care | Payer: No Typology Code available for payment source | Source: Ambulatory Visit | Attending: Infectious Disease | Admitting: Infectious Disease

## 2021-01-23 ENCOUNTER — Ambulatory Visit (INDEPENDENT_AMBULATORY_CARE_PROVIDER_SITE_OTHER): Payer: No Typology Code available for payment source | Admitting: Pharmacist

## 2021-01-23 ENCOUNTER — Other Ambulatory Visit: Payer: Self-pay

## 2021-01-23 DIAGNOSIS — Z113 Encounter for screening for infections with a predominantly sexual mode of transmission: Secondary | ICD-10-CM

## 2021-01-23 DIAGNOSIS — Z79899 Other long term (current) drug therapy: Secondary | ICD-10-CM

## 2021-01-23 DIAGNOSIS — Z23 Encounter for immunization: Secondary | ICD-10-CM

## 2021-01-23 NOTE — Progress Notes (Signed)
Date:  01/23/2021   HPI: Nathan Torres is a 45 y.o. male who presents to the [facility] pharmacy clinic for HIV PrEP follow-up.  Insured   [x]    Uninsured  []    Patient Active Problem List   Diagnosis Date Noted   High risk homosexual behavior 06/10/2020    Patient's Medications  New Prescriptions   No medications on file  Previous Medications   EMTRICITABINE-TENOFOVIR AF (DESCOVY) 200-25 MG TABLET    Take 1 tablet by mouth daily.   MULTIPLE VITAMIN (MULTIVITAMIN) TABLET    Take 1 tablet by mouth daily.   WHEAT DEXTRIN (BENEFIBER PO)    Take by mouth.  Modified Medications   No medications on file  Discontinued Medications   No medications on file    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: [employer]  Tobacco Use   Smoking status: Never   Smokeless tobacco: Never  Vaping Use   Vaping Use: Never used  Substance and Sexual Activity   Alcohol use: Yes    Comment: occasional   Drug use: Never   Sexual activity: Not on file  Other Topics Concern   Not on file  Social History Narrative   Not on file   Social Determinants of Health   Financial Resource Strain: Not on file  Food Insecurity: Not on file  Transportation Needs: Not on file  Physical Activity: Not on file  Stress: Not on file  Social Connections: Not on file    CHL HIV PREP FLOWSHEET RESULTS 03/24/2020  Insurance Status Insured  Gender at birth Male  Gender identity cis-Male  Risk for HIV Hx of STI  Sex Partners Men only  # sex partners past 3-6  mos 3  Sex activity preferences Insertive and receptive;Insertive;Oral  Condom use Yes  % condom use 75  Treated for STI? Yes  HIV symptoms? None    Labs:  SCr: Lab Results  Component Value Date   CREATININE 0.98 10/23/2020   CREATININE 0.98 08/25/2020   HIV Lab Results  Component Value Date   HIV NON-REACTIVE 10/23/2020   HIV NON-REACTIVE 08/25/2020   HIV NON-REACTIVE 06/09/2020   HIV NON-REACTIVE 03/24/2020   Hepatitis B Lab Results  Component Value Date   HEPBSAB NON-REACTIVE 10/23/2020   HEPBSAG NON-REACTIVE 10/23/2020   HEPBCAB NON-REACTIVE 10/23/2020   Hepatitis C Lab Results  Component Value Date   HEPCAB NON-REACTIVE 10/23/2020   Hepatitis A Lab Results  Component Value Date   HAV NON-REACTIVE 03/24/2020   RPR and STI Lab Results  Component Value Date   LABRPR NON-REACTIVE 10/23/2020   LABRPR NON-REACTIVE 08/25/2020   LABRPR NON-REACTIVE 06/09/2020    STI Results GC CT  10/23/2020 Negative Negative  10/23/2020 Negative Negative  10/23/2020 Negative Negative  08/25/2020 Negative Negative  08/25/2020 Negative Negative  08/25/2020 Negative Negative  06/09/2020 Negative Negative  06/09/2020 Negative Negative  06/09/2020 Negative Negative    Assessment: Nathan Torres presents today for 3 month follow up for  HIV PrEP. He currently has 2 partners - his husband and 1 other. He continues on Descovy daily. Denies adverse effects or any concerns today. No concerns for STIs but would like to be tested today.   Hepatitis A antibody and hepatitis B surface antibody are both non-reactive, indicating no immunity. He would like to receive both vaccinations today.   Plan: -Administered 1st of 2 hepatitis A vaccine in office today. Will receive 2nd vaccine in 6 months at PrEP visit.  -Administered 1st of 2 hepatitis B vaccine in office today. Scheduled f/u appt to receive 2nd vaccine in 1 month.  -Check HIV antibody, RPR, oral/rectal/urine cytology -Refill Descovy x3  months if HIV negative -F/u PrEP visit in 3 months  Nathan Torres, PharmD PGY1 Pharmacy Resident 01/23/2021 10:10 AM

## 2021-01-26 ENCOUNTER — Other Ambulatory Visit: Payer: Self-pay | Admitting: Pharmacist

## 2021-01-26 LAB — HIV ANTIBODY (ROUTINE TESTING W REFLEX): HIV 1&2 Ab, 4th Generation: NONREACTIVE

## 2021-01-26 LAB — CYTOLOGY, (ORAL, ANAL, URETHRAL) ANCILLARY ONLY
Chlamydia: NEGATIVE
Chlamydia: NEGATIVE
Comment: NEGATIVE
Comment: NEGATIVE
Comment: NORMAL
Comment: NORMAL
Neisseria Gonorrhea: NEGATIVE
Neisseria Gonorrhea: NEGATIVE

## 2021-01-26 LAB — URINE CYTOLOGY ANCILLARY ONLY
Chlamydia: NEGATIVE
Comment: NEGATIVE
Comment: NORMAL
Neisseria Gonorrhea: NEGATIVE

## 2021-01-26 LAB — RPR: RPR Ser Ql: NONREACTIVE

## 2021-01-26 MED ORDER — EMTRICITABINE-TENOFOVIR AF 200-25 MG PO TABS: 1.0000 | ORAL_TABLET | Freq: Every day | ORAL | 2 refills | Status: DC

## 2021-01-26 NOTE — Progress Notes (Signed)
Patient's HIV antibody is negative.  Will [facility] in [facility] more months of [facility].  

## 2021-02-25 ENCOUNTER — Ambulatory Visit (INDEPENDENT_AMBULATORY_CARE_PROVIDER_SITE_OTHER): Payer: No Typology Code available for payment source | Admitting: Pharmacist

## 2021-02-25 ENCOUNTER — Other Ambulatory Visit (HOSPITAL_COMMUNITY): Payer: Self-pay

## 2021-02-25 ENCOUNTER — Other Ambulatory Visit: Payer: Self-pay

## 2021-02-25 DIAGNOSIS — Z23 Encounter for immunization: Secondary | ICD-10-CM

## 2021-02-25 NOTE — Progress Notes (Signed)
   [facility] Pharmacy Vaccination Visit  HPI: Nathan Torres is a 45 y.o. male who presents to the [facility] pharmacy clinic for vaccine administration.  Hepatitis B Lab Results  Component Value Date   HEPBSAB NON-REACTIVE 10/23/2020   Lab Results  Component Value Date   HEPBSAG NON-REACTIVE 10/23/2020    Hepatitis C No results found for: HCVAB  Hepatitis A Lab Results  Component Value Date   HAV NON-REACTIVE 03/24/2020    Assessment & Plan: - Administered Heplisav B vaccine #2/2 - Patient tolerated well - Next appointment for PrEP on 9/13  Discussed Apretude with patient. He will do some research on his own and let me know if he is interested at his next PrEP appointment.  Hilliard Borges L. Murry Khiev, PharmD, BCIDP, AAHIVP, CPP Clinical Pharmacist Practitioner Infectious Diseases Clinical Pharmacist [facility] 02/25/2021, 2:02 PM

## 2021-04-28 ENCOUNTER — Other Ambulatory Visit (HOSPITAL_COMMUNITY)
Admission: RE | Admit: 2021-04-28 | Discharge: 2021-04-28 | Disposition: A | Discharge status: Home / Self Care | Payer: No Typology Code available for payment source | Source: Ambulatory Visit | Attending: Infectious Disease | Admitting: Infectious Disease

## 2021-04-28 ENCOUNTER — Other Ambulatory Visit: Payer: Self-pay

## 2021-04-28 ENCOUNTER — Ambulatory Visit (INDEPENDENT_AMBULATORY_CARE_PROVIDER_SITE_OTHER): Payer: No Typology Code available for payment source | Admitting: Pharmacist

## 2021-04-28 ENCOUNTER — Other Ambulatory Visit (HOSPITAL_COMMUNITY): Payer: Self-pay

## 2021-04-28 DIAGNOSIS — Z79899 Other long term (current) drug therapy: Secondary | ICD-10-CM | POA: Diagnosis present

## 2021-04-28 NOTE — Progress Notes (Signed)
Date:  04/28/2021   HPI: Nathan Torres is a 45 y.o. male who presents to the [facility] pharmacy clinic for HIV PrEP follow-up.  Insured   [x]    Uninsured  []    Patient Active Problem List   Diagnosis Date Noted   High risk homosexual behavior 06/10/2020    Patient's Medications  New Prescriptions   No medications on file  Previous Medications   EMTRICITABINE-TENOFOVIR AF (DESCOVY) 200-25 MG TABLET    Take 1 tablet by mouth daily.   MULTIPLE VITAMIN (MULTIVITAMIN) TABLET    Take 1 tablet by mouth daily.   WHEAT DEXTRIN (BENEFIBER PO)    Take by mouth.  Modified Medications   No medications on file  Discontinued Medications   No medications on file    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: [employer]  Tobacco Use   Smoking status: Never   Smokeless tobacco: Never  Vaping Use   Vaping Use: Never used  Substance and Sexual Activity   Alcohol use: Yes    Comment: occasional   Drug use: Never   Sexual activity: Not on file  Other Topics Concern   Not on file  Social History Narrative   Not on file   Social Determinants of Health   Financial Resource Strain: Not on file  Food Insecurity: Not on file  Transportation Needs: Not on file  Physical Activity: Not on file  Stress: Not on file  Social Connections: Not on file    CHL HIV PREP FLOWSHEET RESULTS 03/24/2020  Insurance Status Insured  Gender at birth Male  Gender identity cis-Male  Risk for HIV Hx of STI  Sex Partners Men only  # sex partners past 3-6  mos 3  Sex activity preferences Insertive and receptive;Insertive;Oral  Condom use Yes  % condom use 75  Treated for STI? Yes  HIV symptoms? None    Labs:  SCr: Lab Results  Component Value Date   CREATININE 0.98 10/23/2020   CREATININE 0.98 08/25/2020   HIV Lab Results  Component Value Date   HIV NON-REACTIVE 01/23/2021   HIV NON-REACTIVE 10/23/2020   HIV NON-REACTIVE 08/25/2020   HIV NON-REACTIVE 06/09/2020   HIV NON-REACTIVE 03/24/2020   Hepatitis B Lab Results  Component Value Date   HEPBSAB NON-REACTIVE 10/23/2020   HEPBSAG NON-REACTIVE 10/23/2020   HEPBCAB NON-REACTIVE 10/23/2020   Hepatitis C Lab Results  Component Value Date   HEPCAB NON-REACTIVE 10/23/2020   Hepatitis A Lab Results  Component Value Date   HAV NON-REACTIVE 03/24/2020   RPR and STI Lab Results  Component Value Date   LABRPR NON-REACTIVE 01/23/2021   LABRPR NON-REACTIVE 10/23/2020   LABRPR NON-REACTIVE 08/25/2020   LABRPR NON-REACTIVE 06/09/2020    STI Results GC CT  01/23/2021 Negative Negative  01/23/2021 Negative Negative  01/23/2021 Negative Negative  10/23/2020 Negative Negative  10/23/2020 Negative Negative  10/23/2020 Negative Negative  08/25/2020 Negative Negative  08/25/2020 Negative Negative  08/25/2020 Negative Negative  06/09/2020 Negative  Negative  06/09/2020 Negative Negative  06/09/2020 Negative Negative    Assessment: Nathan Torres presents to clinic today for PrEP follow-up. He has no adherence or adverse event issues. States he has plenty of Descovy left at this time and would like to continue receiving [facility]. He still has his one sexual partner outside of his marriage his husband follows with a PrEP clinic in [place] as well. He is interested in Apretude, so Lupita Leash will look into insurance coverage for him. In the meantime, will send in Descovy refills if HIV antibody negative. Will check HIV ab, oral/rectal/urine cytologies, and HBV  titer after his vaccination series was completed in July. Will need his second hepatitis A shot at his next visit.  Discussed the monkeypox vaccine with Dimas Aguas, and he will think about it at this time and call if he wants to schedule appointment. Stated his husband is welcome to schedule an appointment to receive the shot as well.  Plan: Check HIV ab, urine/rectal/oral cytologies, and HBV titer If HIV ab, refill Descovy x 3 months Follow-up with me on 12/13 at 11:30  Margarite Gouge, PharmD, CPP Clinical Pharmacist Practitioner Infectious Diseases Clinical Pharmacist [facility] 04/28/2021, 11:27 AM

## 2021-04-29 LAB — CYTOLOGY, (ORAL, ANAL, URETHRAL) ANCILLARY ONLY
Chlamydia: NEGATIVE
Chlamydia: NEGATIVE
Comment: NEGATIVE
Comment: NEGATIVE
Comment: NORMAL
Comment: NORMAL
Neisseria Gonorrhea: NEGATIVE
Neisseria Gonorrhea: NEGATIVE

## 2021-04-29 LAB — URINE CYTOLOGY ANCILLARY ONLY
Chlamydia: NEGATIVE
Comment: NEGATIVE
Comment: NORMAL
Neisseria Gonorrhea: NEGATIVE

## 2021-04-29 LAB — HEPATITIS B SURFACE ANTIBODY, QUANTITATIVE: Hep B S AB Quant (Post): 52 m[IU]/mL (ref >=10)

## 2021-04-29 LAB — HIV ANTIBODY (ROUTINE TESTING W REFLEX): HIV 1&2 Ab, 4th Generation: NONREACTIVE

## 2021-05-01 ENCOUNTER — Other Ambulatory Visit: Payer: Self-pay | Admitting: Pharmacist

## 2021-05-01 DIAGNOSIS — Z79899 Other long term (current) drug therapy: Secondary | ICD-10-CM

## 2021-05-01 MED ORDER — EMTRICITABINE-TENOFOVIR AF 200-25 MG PO TABS: 1.0000 | ORAL_TABLET | Freq: Every day | ORAL | 2 refills | Status: DC

## 2021-05-28 ENCOUNTER — Other Ambulatory Visit (HOSPITAL_COMMUNITY): Payer: Self-pay

## 2021-05-29 ENCOUNTER — Other Ambulatory Visit (HOSPITAL_COMMUNITY): Payer: Self-pay

## 2021-05-29 ENCOUNTER — Telehealth: Payer: Self-pay

## 2021-05-29 NOTE — Telephone Encounter (Signed)
[facility] Patient Advocate Encounter  Apretude is covered under patients medical billing.  Patient will have a $35.00 office copay. No deductible medication is covered 100%.  Ref # [id number]  Patient is enrolled in [employer] Claims Portal         Clearance Coots, CPhT Specialty Pharmacy Patient [facility] for Infect[facility] Disease Phone: [phone] Fax:  [phone]

## 2021-06-01 ENCOUNTER — Encounter: Payer: Self-pay | Admitting: Pharmacist

## 2021-07-28 ENCOUNTER — Other Ambulatory Visit: Payer: Self-pay

## 2021-07-28 ENCOUNTER — Ambulatory Visit (INDEPENDENT_AMBULATORY_CARE_PROVIDER_SITE_OTHER): Payer: No Typology Code available for payment source | Admitting: Pharmacist

## 2021-07-28 DIAGNOSIS — Z23 Encounter for immunization: Secondary | ICD-10-CM | POA: Diagnosis not present

## 2021-07-28 DIAGNOSIS — Z79899 Other long term (current) drug therapy: Secondary | ICD-10-CM

## 2021-07-28 NOTE — Progress Notes (Signed)
Date:  07/28/2021   HPI: AZARIA BARTELL is a 45 y.o. male who presents to the [facility] pharmacy clinic for HIV PrEP follow-up.  Insured   [x]    Uninsured  []    Patient Active Problem List   Diagnosis Date Noted   High risk homosexual behavior 06/10/2020    Patient's Medications  New Prescriptions   No medications on file  Previous Medications   MULTIPLE VITAMIN (MULTIVITAMIN) TABLET    Take 1 tablet by mouth daily.   WHEAT DEXTRIN (BENEFIBER PO)    Take by mouth.  Modified Medications   No medications on file  Discontinued Medications   EMTRICITABINE-TENOFOVIR AF (DESCOVY) 200-25 MG TABLET    Take 1 tablet by mouth daily.    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: [employer]  Tobacco Use   Smoking status: Never   Smokeless tobacco: Never  Vaping Use   Vaping Use: Never used  Substance and Sexual Activity   Alcohol use: Yes    Comment: occasional   Drug use: Never   Sexual activity: Not on file  Other Topics Concern   Not on file  Social History Narrative   Not on file   Social Determinants of Health   Financial Resource Strain: Not on file  Food Insecurity: Not on file  Transportation Needs: Not on file  Physical Activity: Not on file  Stress: Not on file  Social Connections: Not on file    CHL HIV PREP FLOWSHEET RESULTS 03/24/2020  Insurance Status Insured  Gender at birth Male  Gender identity cis-Male  Risk for HIV Hx of STI  Sex Partners Men only  # sex partners past 3-6 mos 3  Sex activity  preferences Insertive and receptive;Insertive;Oral  Condom use Yes  % condom use 75  Treated for STI? Yes  HIV symptoms? None    Labs:  SCr: Lab Results  Component Value Date   CREATININE 0.98 10/23/2020   CREATININE 0.98 08/25/2020   HIV Lab Results  Component Value Date   HIV NON-REACTIVE 04/28/2021   HIV NON-REACTIVE 01/23/2021   HIV NON-REACTIVE 10/23/2020   HIV NON-REACTIVE 08/25/2020   HIV NON-REACTIVE 06/09/2020   Hepatitis B Lab Results  Component Value Date   HEPBSAB NON-REACTIVE 10/23/2020   HEPBSAG NON-REACTIVE 10/23/2020   HEPBCAB NON-REACTIVE 10/23/2020   Hepatitis C Lab Results  Component Value Date   HEPCAB NON-REACTIVE 10/23/2020   Hepatitis A Lab Results  Component Value Date   HAV NON-REACTIVE 03/24/2020   RPR and STI Lab Results  Component Value Date   LABRPR NON-REACTIVE 01/23/2021   LABRPR NON-REACTIVE 10/23/2020   LABRPR NON-REACTIVE 08/25/2020   LABRPR NON-REACTIVE 06/09/2020    STI Results GC CT  04/28/2021 Negative Negative  04/28/2021 Negative Negative  04/28/2021 Negative Negative  01/23/2021 Negative Negative  01/23/2021 Negative Negative  01/23/2021 Negative Negative  10/23/2020 Negative Negative  10/23/2020 Negative Negative  10/23/2020 Negative Negative  08/25/2020 Negative Negative  08/25/2020 Negative Negative  08/25/2020 Negative Negative  06/09/2020 Negative Negative  06/09/2020 Negative Negative  06/09/2020 Negative Negative    Assessment: Mohsen comes in today to follow up for PrEP. He takes Descovy with no issues or concerns. He would like to stop PrEP at this time as he and his husband have decided to become monogamous. Will reach out to me for PrEP restart if the situation ever changes. He was last sexually active with someone other than his husband in July. Will recheck HIV one last time and administer his 2nd and final Hepatitis A vaccine today.   Plan: - HIV antibody today - #2/2 Hepatitis A vaccine - RTC  PRN  Enes Rokosz L. Lutricia Widjaja, PharmD, BCIDP, AAHIVP, CPP Clinical Pharmacist Practitioner Infectious Diseases Clinical Pharmacist [facility]

## 2021-07-29 LAB — HIV ANTIBODY (ROUTINE TESTING W REFLEX): HIV 1&2 Ab, 4th Generation: NONREACTIVE

## 2021-12-21 ENCOUNTER — Other Ambulatory Visit: Payer: Self-pay | Admitting: Registered Nurse

## 2021-12-21 DIAGNOSIS — Z8249 Family history of ischemic heart disease and other diseases of the circulatory system: Secondary | ICD-10-CM

## 2022-01-20 ENCOUNTER — Ambulatory Visit
Admission: RE | Admit: 2022-01-20 | Discharge: 2022-01-20 | Disposition: A | Discharge status: Home / Self Care | Payer: No Typology Code available for payment source | Source: Ambulatory Visit | Attending: Registered Nurse | Admitting: Registered Nurse

## 2022-01-20 DIAGNOSIS — Z8249 Family history of ischemic heart disease and other diseases of the circulatory system: Secondary | ICD-10-CM

## 2022-02-28 ENCOUNTER — Encounter: Payer: Self-pay | Admitting: Cardiovascular Disease

## 2022-02-28 NOTE — Progress Notes (Signed)
Cardiology Office Note:    Date:  03/02/2022   ID:  [name], DOB August 10, 1976, MRN [id number]  PCP:  Merrilee Seashore, [place] Providers Cardiologist:  Glenda Kunst   }    Referring MD: Merrilee Seashore, MD   Chief Complaint  Patient presents with   coronary artery calcification     History of Present Illness:    Nathan Torres is a 46 y.o. male with a hx of  coronary artery calcifications and a family history of coronary and carotid disease.  Coronary calcium score is 70.1 and this is at percentile 91 for patients of the same age, gender and ethnicity.  Rest symptoms see him today for further evaluation. Has GERD symptoms  Exercises 4 days a week ( starting in Jan)  No CP  Elliptical machine. 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  White blood cell count of 6.5.  Liver enzymes-ALT is 51 which is in normal range.  Creatinine is 1.08.  Potassium is 3.9.  Sodium is 139.  Cholesterol level is 190.  HDL is 42.  LDL is 119.  Triglyceride levels 145.    Past Medical History:  Diagnosis Date   Bloody stools    Per [facility] encounter 03/13/2020   Hemorrhoids    Per [facility] encounter 03/13/2020    Past Surgical History:  Procedure Laterality Date   vocal cords     WISDOM TOOTH EXTRACTION      Current Medications: Current Meds  Medication Sig   Multiple Vitamin (MULTIVITAMIN) tablet Take 1 tablet by mouth daily.   rosuvastatin (CRESTOR) 20 MG tablet Take 1 tablet (20 mg total) by mouth daily.   Wheat Dextrin (BENEFIBER PO) Take by mouth.     Allergies:   Patient has no known allergies.   Social History   Socioeconomic History   Marital status: Married    Spouse name: Not on file   Number of children: Not on file   Years of education: Not on file   Highest education level: Not on file  Occupational  History   Occupation: life Theatre manager    Employer: [employer]  Tobacco Use   Smoking status: Never   Smokeless tobacco: Never  Vaping Use   Vaping Use: Never used  Substance and Sexual Activity   Alcohol use: Yes    Comment: occasional   Drug use: Never   Sexual activity: Not on file  Other Topics Concern   Not on file  Social History Narrative   Not on file   Social Determinants of Health   Financial Resource Strain: Not on file  Food Insecurity: Not on file  Transportation Needs: Not on file  Physical Activity: Not on file  Stress: Not on file  Social Connections: Not on file     Family History: The patient's family history includes Heart disease in his mother; Hypertension in his brother, father, and mother; Thyroid disease in his mother. There is no history of Stomach cancer, Colon cancer, Esophageal cancer, Pancreatic cancer, or Rectal cancer.  ROS:   Please see the history of present illness.     All other systems reviewed and are negative.  EKGs/Labs/Other Studies Reviewed:    The following studies were reviewed today:   EKG: March 02, 2022: Sinus bradycardia 53.  Otherwise normal EKG.  Recent Labs: No results found for requested labs within last 365 days.  Recent Lipid Panel No results found for: "CHOL", "TRIG", "HDL", "CHOLHDL", "VLDL", "LDLCALC", "LDLDIRECT"   Risk Assessment/Calculations:           Physical Exam:    VS:  BP 118/72   Pulse (!) 53   Ht 5\' 6"  (1.676 m)   Wt 194 lb (88 kg)   SpO2 94%   BMI 31.31 kg/m     Wt Readings from Last 3 Encounters:  03/02/22 194 lb (88 kg)  10/23/20 190 lb (86.2 kg)  06/27/20 185 lb (83.9 kg)     GEN:  Well nourished, well developed in no acute distress HEENT: Normal NECK: No JVD; No carotid bruits LYMPHATICS: No lymphadenopathy CARDIAC: RRR, no murmurs, rubs, gallops RESPIRATORY:  Clear to auscultation without rales, wheezing or rhonchi  ABDOMEN: Soft, non-tender,  non-distended MUSCULOSKELETAL:  No edema; No deformity  SKIN: Warm and dry NEUROLOGIC:  Alert and oriented x 3 PSYCHIATRIC:  Normal affect   ASSESSMENT:    1. Mixed hyperlipidemia   2. Elevated coronary artery calcium score   3. Family history of early CAD    PLAN:       Coronary calcium :   Benen  presents for further evaluation and discussion of about his hyperlipidemia and coronary calcifications.  He has a strong family history of coronary artery disease and carotid artery disease.  His last LDL is 119.  His coronary artery calcium score of 70 places him in the 91st percentile for age and gender matched controls.  His LDL goal is between 50 and 70.  We will start him on rosuvastatin 20 mg a day. We will check lipids and ALT in 3 months.  If he is not to goal we will consider starting him on a PCSK9 inhibitor or inclisiran.  Cont exercise, diet            Medication Adjustments/Labs and Tests Ordered: Current medicines are reviewed at length with the patient today.  Concerns regarding medicines are outlined above.  Orders Placed This Encounter  Procedures   ALT   Lipid panel   EKG 12-Lead   Meds ordered this encounter  Medications   rosuvastatin (CRESTOR) 20 MG tablet    Sig: Take 1 tablet (20 mg total) by mouth daily.    Dispense:  90 tablet    Refill:  3     Patient Instructions  Medication Instructions:  START Rosuvastatin 20mg  daily *If you need a refill on your cardiac medications before your next appointment, please call your pharmacy*   Lab Work: Lipids, ALT in 3 months If you have labs (blood work) drawn today and your tests are completely normal, you will receive your results only by: MyChart Message (if you have MyChart) OR A paper copy in the mail If you have any lab test that is abnormal or we need to change your treatment, we will call you to review the results.   Testing/Procedures: NONE   Follow-Up: At [facility], you and your  health needs are our priority.  As part of our continuing mission to provide you with exceptional heart care, we have created designated Provider Care Teams.  These Care Teams include your primary Cardiologist (physician) and Advanced Practice Providers (APPs -  Physician Assistants and Nurse Practitioners) who all work together to provide you with the care you need, when you need it.  Your next appointment:   1 year(s)  The format  for your next appointment:   In Person  Provider:   Suzzanne Cloud, or Zyanna Leisinger {    Important Information About Sugar         Signed, Kristeen Miss, MD  03/02/2022 11:22 AM    [facility]

## 2022-03-02 ENCOUNTER — Ambulatory Visit: Payer: No Typology Code available for payment source | Admitting: Cardiovascular Disease

## 2022-03-02 ENCOUNTER — Encounter: Payer: Self-pay | Admitting: Cardiovascular Disease

## 2022-03-02 VITALS — BP 118/72 | HR 53 | Ht 66.0 in | Wt 194.0 lb

## 2022-03-02 DIAGNOSIS — E782 Mixed hyperlipidemia: Secondary | ICD-10-CM | POA: Diagnosis not present

## 2022-03-02 DIAGNOSIS — R931 Abnormal findings on diagnostic imaging of heart and coronary circulation: Secondary | ICD-10-CM

## 2022-03-02 DIAGNOSIS — Z8249 Family history of ischemic heart disease and other diseases of the circulatory system: Secondary | ICD-10-CM

## 2022-03-02 MED ORDER — ROSUVASTATIN CALCIUM 20 MG PO TABS: 20.0000 mg | ORAL_TABLET | Freq: Every day | ORAL | 3 refills | Status: DC

## 2022-03-02 NOTE — Patient Instructions (Signed)
Medication Instructions:  START Rosuvastatin 20mg  daily *If you need a refill on your cardiac medications before your next appointment, please call your pharmacy*   Lab Work: Lipids, ALT in 3 months If you have labs (blood work) drawn today and your tests are completely normal, you will receive your results only by: MyChart Message (if you have MyChart) OR A paper copy in the mail If you have any lab test that is abnormal or we need to change your treatment, we will call you to review the results.   Testing/Procedures: NONE   Follow-Up: At [facility], you and your health needs are our priority.  As part of our continuing mission to provide you with exceptional heart care, we have created designated Provider Care Teams.  These Care Teams include your primary Cardiologist (physician) and Advanced Practice Providers (APPs -  Physician Assistants and Nurse Practitioners) who all work together to provide you with the care you need, when you need it.  Your next appointment:   1 year(s)  The format for your next appointment:   In Person  Provider:   [facility], or Nahser {    Important Information About Sugar

## 2022-03-09 ENCOUNTER — Other Ambulatory Visit: Payer: Self-pay

## 2022-03-09 DIAGNOSIS — Z8249 Family history of ischemic heart disease and other diseases of the circulatory system: Secondary | ICD-10-CM

## 2022-03-09 DIAGNOSIS — R931 Abnormal findings on diagnostic imaging of heart and coronary circulation: Secondary | ICD-10-CM

## 2022-03-09 DIAGNOSIS — E782 Mixed hyperlipidemia: Secondary | ICD-10-CM

## 2022-03-09 MED ORDER — ROSUVASTATIN CALCIUM 20 MG PO TABS: 20.0000 mg | ORAL_TABLET | Freq: Every day | ORAL | 3 refills | Status: DC

## 2022-06-04 ENCOUNTER — Ambulatory Visit: Payer: No Typology Code available for payment source | Attending: Cardiovascular Disease

## 2022-06-04 DIAGNOSIS — Z8249 Family history of ischemic heart disease and other diseases of the circulatory system: Secondary | ICD-10-CM

## 2022-06-04 DIAGNOSIS — E782 Mixed hyperlipidemia: Secondary | ICD-10-CM

## 2022-06-04 DIAGNOSIS — R931 Abnormal findings on diagnostic imaging of heart and coronary circulation: Secondary | ICD-10-CM

## 2022-06-04 LAB — LIPID PANEL
Chol/HDL Ratio: 3.1 ratio (ref 0.0–5.0)
Cholesterol, Total: 114 mg/dL (ref 100–199)
HDL: 37 mg/dL — ABNORMAL LOW (ref >39)
LDL Chol Calc (NIH): 58 mg/dL (ref 0–99)
Triglycerides: 102 mg/dL (ref 0–149)
VLDL Cholesterol Cal: 19 mg/dL (ref 5–40)

## 2022-06-04 LAB — ALT: ALT: 32 IU/L (ref 0–44)

## 2022-08-25 ENCOUNTER — Ambulatory Visit: Payer: No Typology Code available for payment source | Admitting: Pharmacist

## 2022-08-30 ENCOUNTER — Other Ambulatory Visit: Payer: Self-pay

## 2022-08-30 ENCOUNTER — Ambulatory Visit (INDEPENDENT_AMBULATORY_CARE_PROVIDER_SITE_OTHER): Payer: No Typology Code available for payment source | Admitting: Pharmacist

## 2022-08-30 DIAGNOSIS — Z2981 Encounter for HIV pre-exposure prophylaxis: Secondary | ICD-10-CM | POA: Diagnosis not present

## 2022-08-30 DIAGNOSIS — Z113 Encounter for screening for infections with a predominantly sexual mode of transmission: Secondary | ICD-10-CM

## 2022-08-30 DIAGNOSIS — Z79899 Other long term (current) drug therapy: Secondary | ICD-10-CM

## 2022-08-30 NOTE — Progress Notes (Cosign Needed Addendum)
Date:  08/30/2022   HPI: Nathan Torres is a 47 y.o. male who presents to the [place] clinic to restart PrEP.  Insured   [x]    Uninsured  []    Patient Active Problem List   Diagnosis Date Noted   High risk homosexual behavior 06/10/2020    Patient's Medications  New Prescriptions   No medications on file  Previous Medications   MULTIPLE VITAMIN (MULTIVITAMIN) TABLET    Take 1 tablet by mouth daily.   ROSUVASTATIN (CRESTOR) 20 MG TABLET    Take 1 tablet (20 mg total) by mouth daily.   WHEAT DEXTRIN (BENEFIBER PO)    Take by mouth.  Modified Medications   No medications on file  Discontinued Medications   No medications on file    Allergies: No Known Allergies  Past Medical History: Past Medical History:  Diagnosis Date   Bloody stools    Per [facility] encounter 03/13/2020   Hemorrhoids    Per [facility] encounter 03/13/2020    Social History: Social History   Socioeconomic History   Marital status: Married    Spouse name: Not on file   Number of children: Not on file   Years of education: Not on file   Highest education level: Not on file  Occupational History   Occupation: life Chief Executive Officer: [employer]  Tobacco Use   Smoking status: Never   Smokeless tobacco: Never  Vaping Use   Vaping Use: Never used  Substance and Sexual Activity   Alcohol use: Yes    Comment: occasional   Drug use: Never   Sexual activity: Not on file  Other Topics Concern   Not on file  Social History Narrative   Not on file   Social Determinants of Health   Financial Resource Strain: Not on file  Food Insecurity: Not on file  Transportation Needs: Not on file  Physical Activity: Not on file  Stress: Not on file  Social Connections: Not on file       03/24/2020   11:46 AM  CHL HIV PREP FLOWSHEET RESULTS  Insurance Status Insured  Gender at birth Male  Gender identity cis-Male  Risk for HIV Hx of STI  Sex Partners Men only  # sex partners  past 3-6 mos 3  Sex activity preferences Insertive and receptive;Insertive;Oral  Condom use Yes  % condom use 75  Treated for STI? Yes  HIV symptoms? None    Labs:  SCr: Lab Results  Component Value Date   CREATININE 0.98 10/23/2020   CREATININE 0.98 08/25/2020   HIV Lab Results  Component Value Date   HIV NON-REACTIVE 07/28/2021   HIV NON-REACTIVE 04/28/2021   HIV NON-REACTIVE 01/23/2021   HIV NON-REACTIVE 10/23/2020   HIV NON-REACTIVE 08/25/2020   Hepatitis B Lab Results  Component Value Date   HEPBSAB NON-REACTIVE 10/23/2020   HEPBSAG NON-REACTIVE 10/23/2020   HEPBCAB NON-REACTIVE 10/23/2020   Hepatitis C Lab Results  Component Value Date   HEPCAB NON-REACTIVE 10/23/2020   Hepatitis A Lab Results  Component Value Date   HAV NON-REACTIVE 03/24/2020   RPR and STI Lab Results  Component Value Date   LABRPR NON-REACTIVE 01/23/2021   LABRPR NON-REACTIVE 10/23/2020   LABRPR NON-REACTIVE 08/25/2020   LABRPR NON-REACTIVE 06/09/2020    STI Results GC CT  04/28/2021 11:38 AM Negative    Negative    Negative  Negative    Negative    Negative   01/23/2021 10:23 AM Negative  Negative    Negative  Negative    Negative    Negative   10/23/2020  2:24 PM Negative    Negative    Negative  Negative    Negative    Negative   08/25/2020  2:10 PM Negative    Negative    Negative  Negative    Negative    Negative   06/09/2020 11:39 AM Negative    Negative    Negative  Negative    Negative    Negative     Assessment: [name] is here today to restart HIV PrEP. He was last seen in December 2022 where he was taking Descovy every day without any issues. He and his husband decided to become monogamous so he stopped taking PrEP at that time. Today he comes in and states that they are about to open up their relationship again and he would like to restart PrEP. He has not been sexually active with anyone except for his husband since his last visit here. Discussed  restarting daily oral PrEP, taking on-demand 2-11 oral PrEP, and the injectable form of PrEP, Apretude. After some thought, he decided to continue with daily oral PrEP but will think about transitioning to the injection. He politely declines STI testing today as well. He finished his Hepatitis A vaccine series during his visit in December 2022 so will check for immunity today. He states that he is up-to-date with the flu and COVID vaccines. Last BMP was checked in March 2022 so will recheck that today as well. Will send in Descovy x 3 months if he remains HIV negative and see him back in 3 months.   Plan: - HIV antibody, Hepatitis A antibody, and BMP today - Restart Descovy if HIV negative - F/u with me in April  Silvia Hightower L. Jermall Isaacson, PharmD, BCIDP, AAHIVP, CPP Clinical Pharmacist Practitioner Infectious Diseases Clinical Pharmacist [facility] 08/30/2022, 2:42 PM

## 2022-08-31 ENCOUNTER — Other Ambulatory Visit: Payer: Self-pay | Admitting: Pharmacist

## 2022-08-31 DIAGNOSIS — Z79899 Other long term (current) drug therapy: Secondary | ICD-10-CM

## 2022-08-31 LAB — BASIC METABOLIC PANEL
BUN: 17 mg/dL (ref 7–25)
CO2: 28 mmol/L (ref 20–32)
Calcium: 10.4 mg/dL — ABNORMAL HIGH (ref 8.6–10.3)
Chloride: 105 mmol/L (ref 98–110)
Creat: 0.94 mg/dL (ref 0.60–1.29)
Glucose, Bld: 104 mg/dL — ABNORMAL HIGH (ref 65–99)
Potassium: 4.2 mmol/L (ref 3.5–5.3)
Sodium: 142 mmol/L (ref 135–146)

## 2022-08-31 LAB — HIV ANTIBODY (ROUTINE TESTING W REFLEX): HIV 1&2 Ab, 4th Generation: NONREACTIVE

## 2022-08-31 LAB — HEPATITIS A ANTIBODY, TOTAL: Hepatitis A AB,Total: REACTIVE — AB

## 2022-08-31 MED ORDER — DESCOVY 200-25 MG PO TABS: 1.0000 | ORAL_TABLET | Freq: Every day | ORAL | 2 refills | Status: DC

## 2022-10-25 ENCOUNTER — Encounter: Payer: Self-pay | Admitting: Cardiovascular Disease

## 2022-10-25 DIAGNOSIS — E782 Mixed hyperlipidemia: Secondary | ICD-10-CM

## 2022-10-25 NOTE — Telephone Encounter (Signed)
Per OV note on 03/02/22:  Coronary calcium :   Nathan Torres  presents for further evaluation and discussion of about his hyperlipidemia and coronary calcifications.  He has a strong family history of coronary artery disease and carotid artery disease.  His last LDL is 119.  His coronary artery calcium score of 70 places him in the 91st percentile for age and gender matched controls.  His LDL goal is between 50 and 70.  We will start him on rosuvastatin 20 mg a day. We will check lipids and ALT in 3 months.  If he is not to goal we will consider starting him on a PCSK9 inhibitor or inclisiran.  Labs performed 06/04/22 showed an LDL of 58, but patient now intolerant to Rosuvastatin. Routing to MD for advisement. Doesn't look like patient has tried another statin which insurance requires for PCSK9i usage.

## 2022-10-27 ENCOUNTER — Other Ambulatory Visit: Payer: Self-pay | Admitting: Pharmacist

## 2022-10-27 DIAGNOSIS — Z79899 Other long term (current) drug therapy: Secondary | ICD-10-CM

## 2022-10-29 ENCOUNTER — Telehealth: Payer: Self-pay | Admitting: Cardiovascular Disease

## 2022-10-29 NOTE — Telephone Encounter (Signed)
Pt c/o medication issue:  1. Name of Medication: rosuvastatin (CRESTOR) 20 MG tablet   2. How are you currently taking this medication (dosage and times per day)? 1 tablet daily  3. Are you having a reaction (difficulty breathing--STAT)? no  4. What is your medication issue? Patient states his symptoms started in September and they have gotten worse. Patient states his knees are very achy and it is hard to walk sometimes. He also gets some kind of rash all over his body. He says one or 2 spots come up every day.

## 2022-10-29 NOTE — Telephone Encounter (Signed)
Already sent a note for same issue on 10/25/22, but then it was off my radar. Do we want to try him on a different statin, then we should be able to do PCSK9i.

## 2022-11-02 MED ORDER — ATORVASTATIN CALCIUM 40 MG PO TABS: 40.0000 mg | ORAL_TABLET | Freq: Every day | ORAL | 0 refills | Status: DC

## 2022-11-02 NOTE — Addendum Note (Signed)
Addended by: Ma Hillock on: 11/02/2022 12:34 PM   Modules accepted: Orders

## 2022-11-02 NOTE — Telephone Encounter (Signed)
Nahser, Wonda Cheng, MD  You2 days ago    Hold rosuvastatin for 2-4 weeks ( or until his muscle aches resolve) Try atorvastatin 40 mg a day If he has muscle aches with atorvastatin, we will refer to the lipid clinic for consideration for PCSK9 inhibitors or inclisiran  PN   Attempted to call patient back, but went to voicemail. No DPR on file. Left detailed message for him to call back to discuss. Rosuvastatin removed from list and added to allergy list (intolerance).

## 2022-11-02 NOTE — Telephone Encounter (Signed)
Patient called back in, states that he has stopped the Crestor five days ago. At time of call, he states that the rash and knee pain are much better, not fully resolved, but improved. Advised of Dr Elmarie Shiley recommendations. Pt agrees to plan to attempt Atorvastatin. Medication sent to pharmacy for one month supply. He will begin this 11/15/22 and call to let us know how he is tolerating it.

## 2022-11-02 NOTE — Addendum Note (Signed)
Addended by: Ma Hillock on: 11/02/2022 12:50 PM   Modules accepted: Orders

## 2022-11-08 ENCOUNTER — Telehealth: Payer: Self-pay | Admitting: Cardiovascular Disease

## 2022-11-08 NOTE — Telephone Encounter (Signed)
Returned call to patient to inform him that he should go ahead and start the Atorvastatin (April 1st, as discussed in prior call) before seeing PharmD. He verbalized understanding.

## 2022-11-08 NOTE — Telephone Encounter (Signed)
Pt c/o medication issue:  1. Name of Medication:   atorvastatin (LIPITOR) 40 MG tablet    2. How are you currently taking this medication (dosage and times per day)? Not taking yet  3. Are you having a reaction (difficulty breathing--STAT)? no  4. What is your medication issue? Patient wants to know if he should start taking this medication beings that his appointment with PharmD is not until 05/08. Please advise.

## 2022-11-23 ENCOUNTER — Other Ambulatory Visit (HOSPITAL_COMMUNITY)
Admission: RE | Admit: 2022-11-23 | Discharge: 2022-11-23 | Disposition: A | Discharge status: Home / Self Care | Payer: No Typology Code available for payment source | Source: Ambulatory Visit | Attending: Infectious Disease | Admitting: Infectious Disease

## 2022-11-23 ENCOUNTER — Ambulatory Visit (INDEPENDENT_AMBULATORY_CARE_PROVIDER_SITE_OTHER): Payer: No Typology Code available for payment source | Admitting: Pharmacist

## 2022-11-23 ENCOUNTER — Other Ambulatory Visit: Payer: Self-pay

## 2022-11-23 DIAGNOSIS — Z113 Encounter for screening for infections with a predominantly sexual mode of transmission: Secondary | ICD-10-CM | POA: Insufficient documentation

## 2022-11-23 DIAGNOSIS — Z79899 Other long term (current) drug therapy: Secondary | ICD-10-CM

## 2022-11-23 DIAGNOSIS — Z2981 Encounter for HIV pre-exposure prophylaxis: Secondary | ICD-10-CM

## 2022-11-23 NOTE — Progress Notes (Unsigned)
Date:  11/23/2022   HPI: Nathan Torres is a 47 y.o. male who presents to the [facility] pharmacy clinic for HIV PrEP follow-up.  Insured   [x]    Uninsured  []    Patient Active Problem List   Diagnosis Date Noted   High risk homosexual behavior 06/10/2020    Patient's Medications  New Prescriptions   No medications on file  Previous Medications   ATORVASTATIN (LIPITOR) 40 MG TABLET    Take 1 tablet (40 mg total) by mouth daily.   EMTRICITABINE-TENOFOVIR AF (DESCOVY) 200-25 MG TABLET    Take 1 tablet by mouth daily.   MULTIPLE VITAMIN (MULTIVITAMIN) TABLET    Take 1 tablet by mouth daily.   WHEAT DEXTRIN (BENEFIBER PO)    Take by mouth.  Modified Medications   No medications on file  Discontinued Medications   No medications on file    Allergies: Allergies  Allergen Reactions   Rosuvastatin Rash and Other (See Comments)    Aching, full body soreness, associated rash    Past Medical History: Past Medical History:  Diagnosis Date   Bloody stools    Per [facility] encounter 03/13/2020   Hemorrhoids    Per [facility] encounter 03/13/2020    Social History: Social History   Socioeconomic History   Marital status: Married    Spouse name: Not on file   Number of children: Not on file   Years of education: Not on file   Highest education level: Not on file  Occupational History   Occupation: life Advertising account planner: [employer]  Tobacco Use   Smoking status: Never   Smokeless tobacco: Never  Vaping Use   Vaping Use: Never used  Substance and Sexual Activity   Alcohol use: Yes    Comment: occasional   Drug use: Never   Sexual activity: Not on file  Other Topics Concern   Not on file  Social History Narrative   Not on file   Social Determinants of Health   Financial Resource Strain: Not on file  Food Insecurity: Not on file  Transportation Needs: Not on file  Physical Activity: Not on file  Stress: Not on file  Social Connections: Not on file        03/24/2020   11:46 AM  [facility] HIV PREP FLOWSHEET RESULTS  Insurance Status Insured  Gender at birth Male  Gender identity cis-Male  Risk for HIV Hx of STI  Sex Partners Men only  # sex partners past 3-6 mos 3  Sex activity preferences Insertive and receptive;Insertive;Oral  Condom use Yes  % condom use 75  Treated for STI? Yes  HIV symptoms? None    Labs:  SCr: Lab Results  Component Value Date   CREATININE 0.94 08/30/2022   CREATININE 0.98 10/23/2020   CREATININE 0.98 08/25/2020   HIV Lab Results  Component Value Date   HIV NON-REACTIVE 08/30/2022   HIV NON-REACTIVE 07/28/2021   HIV NON-REACTIVE 04/28/2021   HIV NON-REACTIVE 01/23/2021   HIV NON-REACTIVE 10/23/2020   Hepatitis B Lab Results  Component Value Date   HEPBSAB NON-REACTIVE 10/23/2020   HEPBSAG NON-REACTIVE 10/23/2020   HEPBCAB NON-REACTIVE 10/23/2020   Hepatitis C Lab Results  Component Value Date   HEPCAB NON-REACTIVE 10/23/2020   Hepatitis A Lab Results  Component Value Date   HAV REACTIVE (A) 08/30/2022   RPR and STI Lab Results  Component Value Date   LABRPR NON-REACTIVE 01/23/2021   LABRPR NON-REACTIVE 10/23/2020   LABRPR  NON-REACTIVE 08/25/2020   LABRPR NON-REACTIVE 06/09/2020    STI Results GC CT  04/28/2021 11:38 AM Negative    Negative    Negative  Negative    Negative    Negative   01/23/2021 10:23 AM Negative    Negative    Negative  Negative    Negative    Negative   10/23/2020  2:24 PM Negative    Negative    Negative  Negative    Negative    Negative   08/25/2020  2:10 PM Negative    Negative    Negative  Negative    Negative    Negative   06/09/2020 11:39 AM Negative    Negative    Negative  Negative    Negative    Negative     Assessment: Kenrick presents to clinic today for 55-month PrEP follow-up on Descovy. He reports no issues obtaining his medication and no missed doses. He would like to have STI screening performed during today's visit. He  reports no symptoms of STIs and no known exposures.   STI screening performed this visit: (other STI screening declined by patient)  [x]  Urine GC/CT [x]  Oral GC/CT [x]  Rectal GC/CT [x]  RPR [] Trichomonas  All questions and concerns addressed to the patient's satisfaction during the visit.    Plan: - Refill Descovy pending negative HIV Ab test  - F/U HIV Ab  - F/U urine/rectal/oral cytologies, RPR - 108-month PrEP follow-up on 02/22/23 with Marinus Maw, PharmD PGY-2 Infectious Diseases Resident  11/23/2022 2:26 PM

## 2022-11-24 ENCOUNTER — Other Ambulatory Visit: Payer: Self-pay | Admitting: Pharmacist

## 2022-11-24 DIAGNOSIS — Z79899 Other long term (current) drug therapy: Secondary | ICD-10-CM

## 2022-11-24 LAB — CYTOLOGY, (ORAL, ANAL, URETHRAL) ANCILLARY ONLY
Chlamydia: NEGATIVE
Chlamydia: NEGATIVE
Comment: NEGATIVE
Comment: NEGATIVE
Comment: NORMAL
Comment: NORMAL
Neisseria Gonorrhea: NEGATIVE
Neisseria Gonorrhea: NEGATIVE

## 2022-11-24 LAB — HIV ANTIBODY (ROUTINE TESTING W REFLEX): HIV 1&2 Ab, 4th Generation: NONREACTIVE

## 2022-11-24 LAB — RPR: RPR Ser Ql: NONREACTIVE

## 2022-11-24 LAB — URINE CYTOLOGY ANCILLARY ONLY
Chlamydia: NEGATIVE
Comment: NEGATIVE
Comment: NORMAL
Neisseria Gonorrhea: NEGATIVE

## 2022-11-25 ENCOUNTER — Other Ambulatory Visit: Payer: Self-pay | Admitting: Pharmacist

## 2022-11-25 DIAGNOSIS — Z79899 Other long term (current) drug therapy: Secondary | ICD-10-CM

## 2022-11-25 MED ORDER — DESCOVY 200-25 MG PO TABS: 1.0000 | ORAL_TABLET | Freq: Every day | ORAL | 2 refills | Status: DC

## 2022-11-25 NOTE — Progress Notes (Signed)
HIV Antibody negative. Sent in refills of Descovy.  

## 2022-12-10 ENCOUNTER — Other Ambulatory Visit: Payer: Self-pay | Admitting: Cardiovascular Disease

## 2022-12-22 ENCOUNTER — Ambulatory Visit: Payer: No Typology Code available for payment source | Attending: Internal Medicine | Admitting: Pharmacist

## 2022-12-22 ENCOUNTER — Encounter: Payer: Self-pay | Admitting: Pharmacist

## 2022-12-22 DIAGNOSIS — R931 Abnormal findings on diagnostic imaging of heart and coronary circulation: Secondary | ICD-10-CM | POA: Diagnosis not present

## 2022-12-22 DIAGNOSIS — E782 Mixed hyperlipidemia: Secondary | ICD-10-CM

## 2022-12-22 DIAGNOSIS — E785 Hyperlipidemia, unspecified: Secondary | ICD-10-CM | POA: Insufficient documentation

## 2022-12-22 MED ORDER — REPATHA SURECLICK 140 MG/ML ~~LOC~~ SOAJ: 140.0000 mg | SUBCUTANEOUS | 3 refills | Status: DC

## 2022-12-22 NOTE — Addendum Note (Signed)
Addended by: Tameika Heckmann E on: 12/22/2022 04:07 PM   Modules accepted: Orders

## 2022-12-22 NOTE — Progress Notes (Addendum)
Patient ID: [name]                 DOB: [birth year]                    MRN: [id number]     HPI: Nathan Torres is a 47 y.o. male patient referred to lipid clinic by Dr Elease Hashimoto. PMH is significant for coronary artery calcifications and FHx of CAD. 01/20/22 calcium score was 70.1 (91st percentile) and he was started on rosuvastatin 20mg  daily in July 2023. He sent in a message in March 2024 reporting joint pain in both knees that had been ongoing since September (2 months after starting rosuvastatin) as well as itchy rash. He was advised to stop his rosuvastatin for a few weeks, then start atorvastatin 40mg  daily.  Pt presents today for follow up. Reports rash on his chest, arms, and legs that was itchy and red, as well as joint pain in his knees and back that affected his ability to walk. When he stopped rosuvastatin, the rash resolved almost immediately and the joint pain did about 3 weeks later. He saw ortho in the interim who did x-rays that came back normal. He then started on atorvastatin at the beginning of April however it caused the same side effects that his rosuvastatin did within 2 weeks. He stopped the medication and rash and joint pain resolved.  He has a very strong family history of heart disease. His brother passed last year from an MI (also had COVID) at age 82. His mother Nathan Torres had open heart surgery and I follow her lipids as well. His father has carotid artery disease and had 3 stents placed last year.    Current Medications: atorvastatin 40mg  daily Intolerances: rosuvastatin 20mg  daily, atorvastatin 40mg  daily - joint pain in his knees, rash Risk Factors: elevated CAC, FHX CAD LDL goal: 70mg /dL  Diet: vegetarian, was on a cruise last week so diet was atypical then before his labwork completed yesterday  Exercise: elliptical machine 4x a week  Family History: Heart disease in his mother; Hypertension in his brother, father, and mother; Thyroid disease in his  mother.   Social History: Occasional alcohol use.  Labs: 06/04/22: TC 114, TG 102, HDL 37, LDL 58 (rosuvastatin 20mg  daily) 12/21/22: TC 220, TG 164, HDL 39, LDL 151 (no LLT)  Past Medical History:  Diagnosis Date   Bloody stools    Per [facility] encounter 03/13/2020   Hemorrhoids    Per [facility] encounter 03/13/2020    Current Outpatient Medications on File Prior to Visit  Medication Sig Dispense Refill   atorvastatin (LIPITOR) 40 MG tablet Take 1 tablet (40 mg total) by mouth daily. 90 tablet 0   emtricitabine-tenofovir AF (DESCOVY) 200-25 MG tablet Take 1 tablet by mouth daily. 30 tablet 2   Multiple Vitamin (MULTIVITAMIN) tablet Take 1 tablet by mouth daily.     Wheat Dextrin (BENEFIBER PO) Take by mouth.     No current facility-administered medications on file prior to visit.    Allergies  Allergen Reactions   Rosuvastatin Rash and Other (See Comments)    Aching, full body soreness, associated rash    Assessment/Plan:  1. Hyperlipidemia - LDL 151 above goal < 70 given elevated calcium score and family history of premature CAD. Pt is intolerant to 2 statins - he experienced joint pain and rash on both atorvastatin and rosuvastatin with symptom resolution after statin d/c in each case. Not clinically appropriate to rechallenge with  another statin particularly due to rash. Discussed Nexlizet or Repatha today with slight preference towards Repatha given better efficacy as he needs slightly more than 50% LDL lowering to reach his goal. He is willing to start Repatha. Prior auth approved immediately and copay card info sent to pt following visit. Will recheck lipids in 2-3 months including Lp(a) given his family history.  Livier Hendel E. Jani Moronta, PharmD, BCACP, CPP [facility] [address] [place], [place] [address] Phone: [phone]; Fax: [phone] 12/22/2022 4:01 PM

## 2022-12-22 NOTE — Patient Instructions (Addendum)
Your baseline LDL is 151 and your goal is < 70 due to your elevated calcium score and family history of heart disease  I will submit information to your insurance for Repatha and let you know when I hear back.    Repatha is a subcutaneous injection given once every 2 weeks in the fatty tissue of your stomach or upper outer thigh. Store the medication in the fridge. You can let your dose warm up to room temperature for 30 minutes before injecting if you prefer. Repatha will lower your LDL cholesterol by 60% and helps to lower your chance of having a heart attack or stroke.  Recheck fasting cholesterol on Monday, July 22nd any time after 7:30am

## 2022-12-23 LAB — LAB REPORT - SCANNED: EGFR: 77

## 2023-01-21 ENCOUNTER — Other Ambulatory Visit: Payer: Self-pay | Admitting: Pharmacist

## 2023-01-21 DIAGNOSIS — Z79899 Other long term (current) drug therapy: Secondary | ICD-10-CM

## 2023-02-22 ENCOUNTER — Ambulatory Visit: Payer: No Typology Code available for payment source | Admitting: Pharmacist

## 2023-02-22 ENCOUNTER — Other Ambulatory Visit: Payer: Self-pay

## 2023-02-22 ENCOUNTER — Other Ambulatory Visit (HOSPITAL_COMMUNITY)
Admission: RE | Admit: 2023-02-22 | Discharge: 2023-02-22 | Disposition: A | Discharge status: Home / Self Care | Payer: No Typology Code available for payment source | Source: Ambulatory Visit | Attending: Infectious Disease | Admitting: Infectious Disease

## 2023-02-22 DIAGNOSIS — Z2981 Encounter for HIV pre-exposure prophylaxis: Secondary | ICD-10-CM

## 2023-02-22 DIAGNOSIS — Z113 Encounter for screening for infections with a predominantly sexual mode of transmission: Secondary | ICD-10-CM

## 2023-02-22 DIAGNOSIS — Z79899 Other long term (current) drug therapy: Secondary | ICD-10-CM

## 2023-02-22 NOTE — Progress Notes (Signed)
Date:  02/22/2023   HPI: Nathan Torres is a 47 y.o. male who presents to the [facility] pharmacy clinic for HIV PrEP follow-up.  Insured   [x]    Uninsured  []    Patient Active Problem List   Diagnosis Date Noted   Hyperlipidemia 12/22/2022   Elevated coronary artery calcium score 12/22/2022   High risk homosexual behavior 06/10/2020    Patient's Medications  New Prescriptions   No medications on file  Previous Medications   EMTRICITABINE-TENOFOVIR AF (DESCOVY) 200-25 MG TABLET    Take 1 tablet by mouth daily.   EVOLOCUMAB (REPATHA SURECLICK) 140 MG/ML SOAJ    Inject 140 mg into the skin every 14 (fourteen) days.   MULTIPLE VITAMIN (MULTIVITAMIN) TABLET    Take 1 tablet by mouth daily.   WHEAT DEXTRIN (BENEFIBER PO)    Take by mouth.  Modified Medications   No medications on file  Discontinued Medications   No medications on file    Allergies: Allergies  Allergen Reactions   Atorvastatin     Rash and joint pain in his knees on 40mg  daily dosing   Rosuvastatin Rash and Other (See Comments)    Joint pain in his knees, full body aches, rash - on 20mg  daily dosing    Past Medical History: Past Medical History:  Diagnosis Date   Bloody stools    Per [facility] encounter 03/13/2020   Hemorrhoids    Per [facility] encounter 03/13/2020    Social History: Social History   Socioeconomic History   Marital status: Married    Spouse name: Not on file   Number of children: Not on file   Years of education: Not on file   Highest education level: Not on file  Occupational History   Occupation: life Advertising account planner: [employer]  Tobacco Use   Smoking status: Never   Smokeless tobacco: Never  Vaping Use   Vaping Use: Never used  Substance and Sexual Activity   Alcohol use: Yes    Comment: occasional   Drug use: Never   Sexual activity: Not on file  Other Topics Concern   Not on file  Social History Narrative   Not on file   Social Determinants of Health    Financial Resource Strain: Not on file  Food Insecurity: Not on file  Transportation Needs: Not on file  Physical Activity: Not on file  Stress: Not on file  Social Connections: Not on file       03/24/2020   11:46 AM  [facility] HIV PREP FLOWSHEET RESULTS  Insurance Status Insured  Gender at birth Male  Gender identity cis-Male  Risk for HIV Hx of STI  Sex Partners Men only  # sex partners past 3-6 mos 3  Sex activity preferences Insertive and receptive;Insertive;Oral  Condom use Yes  % condom use 75  Treated for STI? Yes  HIV symptoms? None    Labs:  SCr: Lab Results  Component Value Date   CREATININE 0.94 08/30/2022   CREATININE 0.98 10/23/2020   CREATININE 0.98 08/25/2020   HIV Lab Results  Component Value Date   HIV NON-REACTIVE 11/23/2022   HIV NON-REACTIVE 08/30/2022   HIV NON-REACTIVE 07/28/2021   HIV NON-REACTIVE 04/28/2021   HIV NON-REACTIVE 01/23/2021   Hepatitis B Lab Results  Component Value Date   HEPBSAB NON-REACTIVE 10/23/2020   HEPBSAG NON-REACTIVE 10/23/2020   HEPBCAB NON-REACTIVE 10/23/2020   Hepatitis C Lab Results  Component Value Date   HEPCAB NON-REACTIVE 10/23/2020  Hepatitis A Lab Results  Component Value Date   HAV REACTIVE (A) 08/30/2022   RPR and STI Lab Results  Component Value Date   LABRPR NON-REACTIVE 11/23/2022   LABRPR NON-REACTIVE 01/23/2021   LABRPR NON-REACTIVE 10/23/2020   LABRPR NON-REACTIVE 08/25/2020   LABRPR NON-REACTIVE 06/09/2020    STI Results GC CT  11/23/2022  2:38 PM Negative    Negative    Negative  Negative    Negative    Negative   04/28/2021 11:38 AM Negative    Negative    Negative  Negative    Negative    Negative   01/23/2021 10:23 AM Negative    Negative    Negative  Negative    Negative    Negative   10/23/2020  2:24 PM Negative    Negative    Negative  Negative    Negative    Negative   08/25/2020  2:10 PM Negative    Negative    Negative  Negative    Negative     Negative   06/09/2020 11:39 AM Negative    Negative    Negative  Negative    Negative    Negative     Assessment: Nathan Torres comes in today for his 3 month HIV PrEP follow up appointment. He continues to do well on Descovy with no issues or concerns. Screened for acute HIV symptoms such as fatigue, muscle aches, rash, sore throat, lymphadenopathy, headache, night sweats, nausea/vomiting/diarrhea, and fever. Denies any symptoms.  No exposures or symptoms of any STIs today but agrees to full STI testing with RPR and oral/urine/rectal cytologies.  Will check labs and see him back in 3 months.   Plan: - HIV antibody, RPR, and urine/rectal/pharyngeal GC/CT swabs for cytology today - Descovy x 3 months if HIV negative - F/u with me again on 05/24/23  Nathan Torres L. Jannette Fogo, PharmD, BCIDP, AAHIVP, CPP Clinical Pharmacist Practitioner Infectious Diseases Clinical Pharmacist [facility] 02/22/2023, 11:22 AM

## 2023-02-23 ENCOUNTER — Other Ambulatory Visit: Payer: Self-pay | Admitting: Pharmacist

## 2023-02-23 DIAGNOSIS — Z79899 Other long term (current) drug therapy: Secondary | ICD-10-CM

## 2023-02-23 LAB — URINE CYTOLOGY ANCILLARY ONLY
Chlamydia: NEGATIVE
Comment: NEGATIVE
Comment: NORMAL
Neisseria Gonorrhea: NEGATIVE

## 2023-02-23 LAB — CYTOLOGY, (ORAL, ANAL, URETHRAL) ANCILLARY ONLY
Chlamydia: NEGATIVE
Chlamydia: NEGATIVE
Comment: NEGATIVE
Comment: NEGATIVE
Comment: NORMAL
Comment: NORMAL
Neisseria Gonorrhea: NEGATIVE
Neisseria Gonorrhea: NEGATIVE

## 2023-02-23 LAB — RPR: RPR Ser Ql: NONREACTIVE

## 2023-02-23 LAB — HIV ANTIBODY (ROUTINE TESTING W REFLEX): HIV 1&2 Ab, 4th Generation: NONREACTIVE

## 2023-02-23 MED ORDER — DESCOVY 200-25 MG PO TABS
1.0000 | ORAL_TABLET | Freq: Every day | ORAL | 2 refills | Status: DC
Start: 2023-02-23 — End: 2024-04-11

## 2023-03-02 ENCOUNTER — Other Ambulatory Visit: Payer: Self-pay

## 2023-03-02 DIAGNOSIS — E782 Mixed hyperlipidemia: Secondary | ICD-10-CM

## 2023-03-03 LAB — HEPATIC FUNCTION PANEL
ALT: 43 IU/L (ref 0–44)
AST: 27 IU/L (ref 0–40)
Albumin: 4.6 g/dL (ref 4.1–5.1)
Alkaline Phosphatase: 78 IU/L (ref 44–121)
Bilirubin Total: 0.4 mg/dL (ref 0.0–1.2)
Bilirubin, Direct: 0.14 mg/dL (ref 0.00–0.40)
Total Protein: 7.5 g/dL (ref 6.0–8.5)

## 2023-03-03 LAB — LIPID PANEL
Chol/HDL Ratio: 2.3 ratio (ref 0.0–5.0)
Cholesterol, Total: 98 mg/dL — ABNORMAL LOW (ref 100–199)
HDL: 43 mg/dL (ref >39)
LDL Chol Calc (NIH): 31 mg/dL (ref 0–99)
Triglycerides: 139 mg/dL (ref 0–149)
VLDL Cholesterol Cal: 24 mg/dL (ref 5–40)

## 2023-03-05 LAB — LIPOPROTEIN A (LPA): Lipoprotein (a): 9.6 nmol/L (ref <75.0)

## 2023-03-07 ENCOUNTER — Ambulatory Visit: Payer: No Typology Code available for payment source

## 2023-04-29 IMAGING — CT CT CARDIAC CORONARY ARTERY CALCIUM SCORE
3 series · 13 of 20 positions shown, 15 images · non-contrast
Comparison: None Available.

CLINICAL DATA: 45-year-old white male with family history of heart
disease in brother.

EXAM:
CT CARDIAC CORONARY ARTERY CALCIUM SCORE
TECHNIQUE: Non-contrast imaging through the heart was performed using
prospective ECG gating. Image post processing was performed on an
independent workstation, allowing for quantitative analysis of the
heart and coronary arteries. Note that this exam targets the heart
and the chest was not imaged in its entirety.

[Series 2: calcium scoring 2.00 qr36 bestdiast 71% hrt calciu · axial · 0.43mm/px · z∈[+1595,+1635]mm · 3 of 51 slices shown]
[im 11/51  vessel]
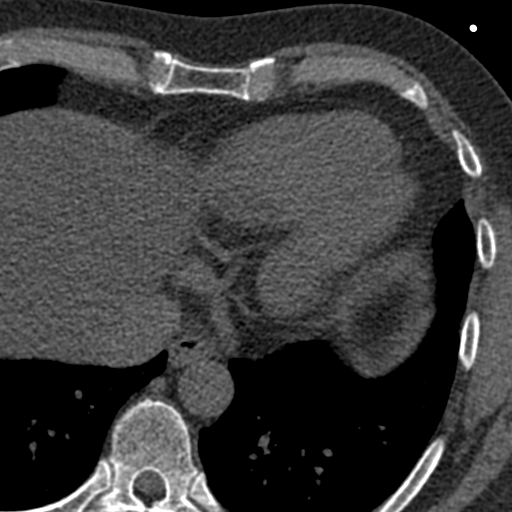
[im 21/51  vessel]
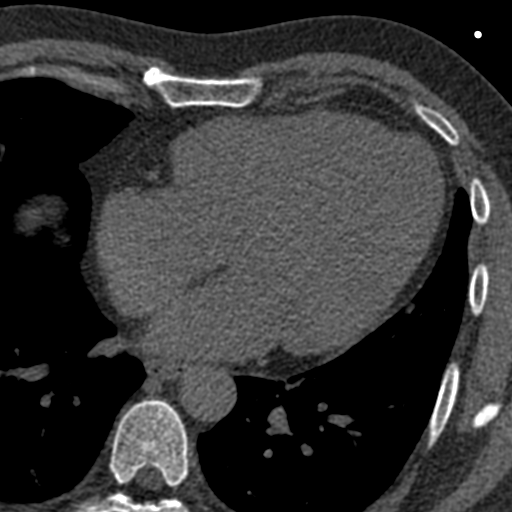
[im 31/51  vessel]
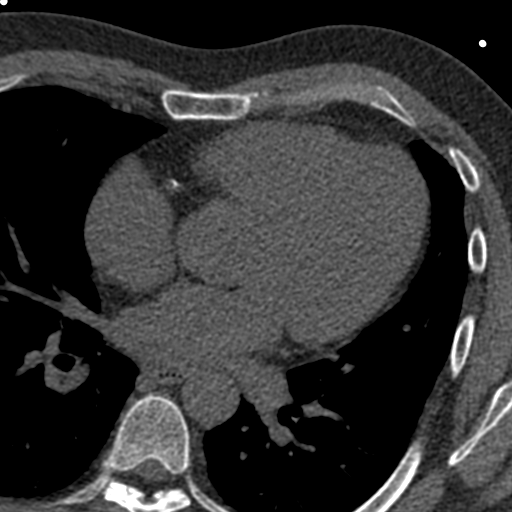

[Series 3: calcium scoring 2.00 br40 bestdiast 71% axial · axial · 0.54mm/px · z∈[+1590,+1666]mm · 5 of 58 slices shown, 7 images]
[im 10/58  vessel]
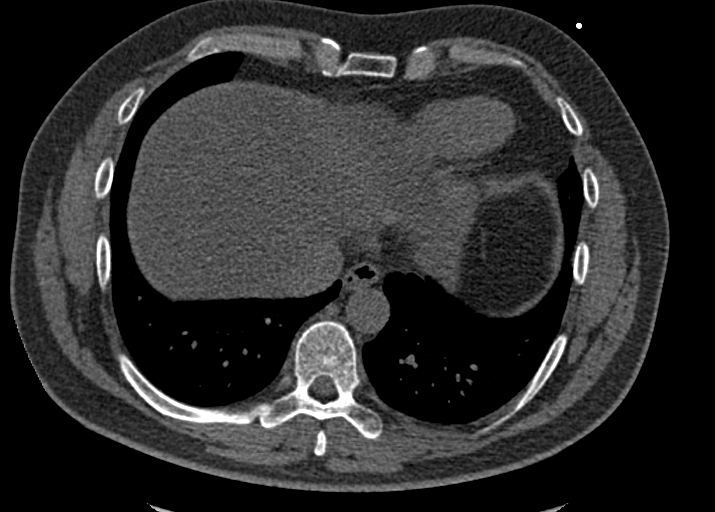
[im 10/58  lung]
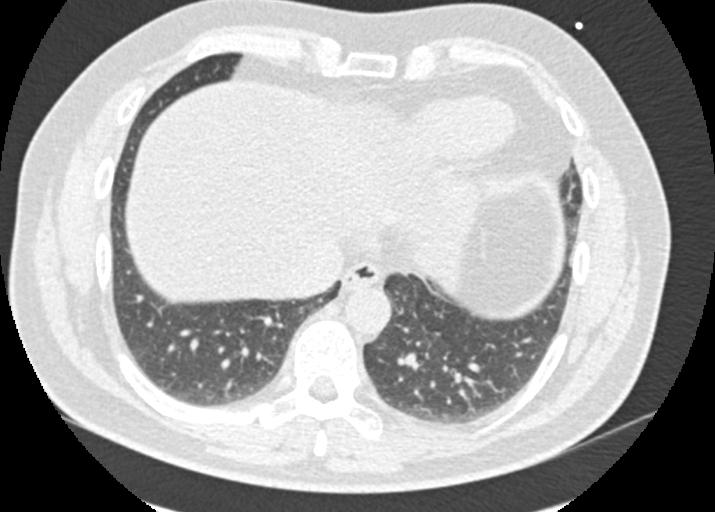
[im 20/58  vessel]
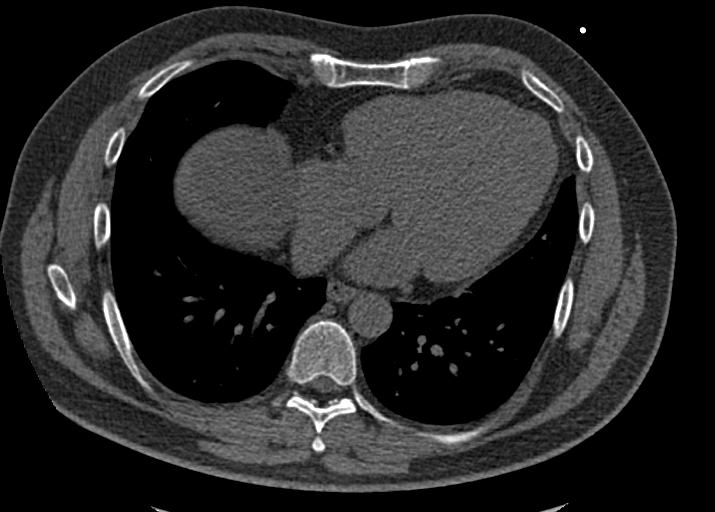
[im 29/58  vessel]
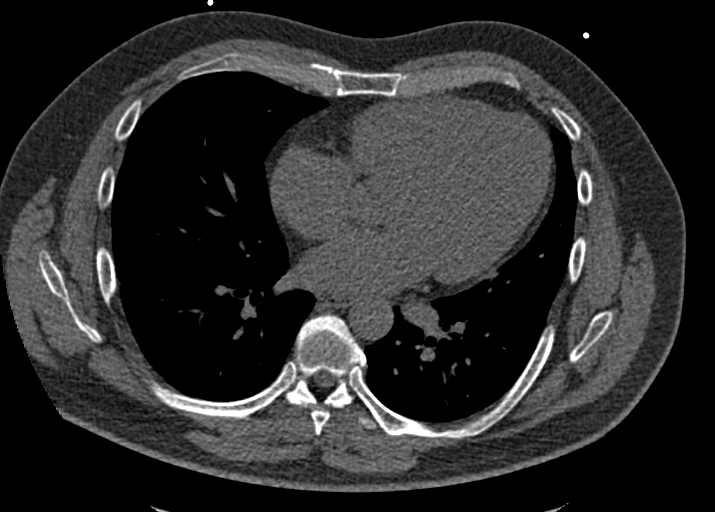
[im 39/58  vessel]
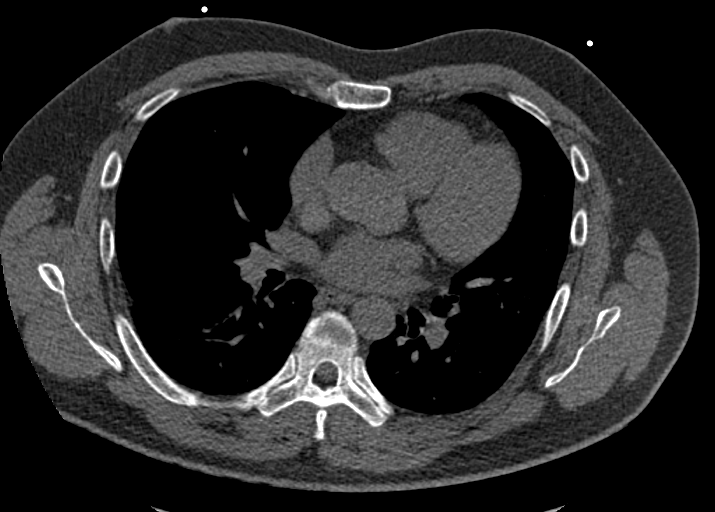
[im 48/58  vessel]
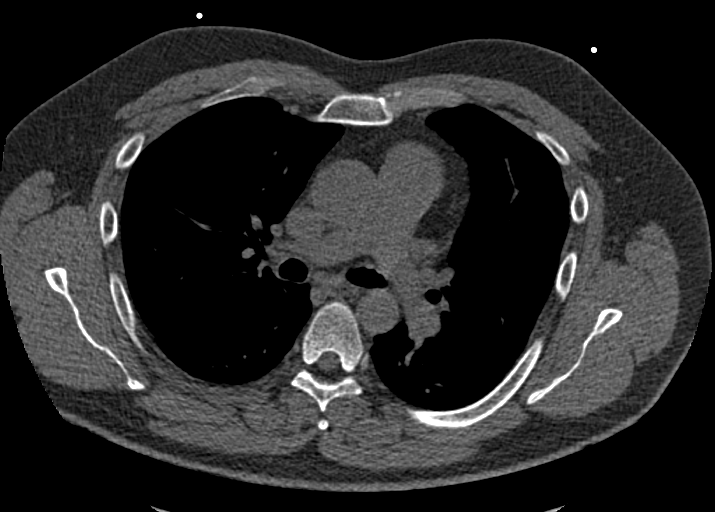
[im 48/58  lung]
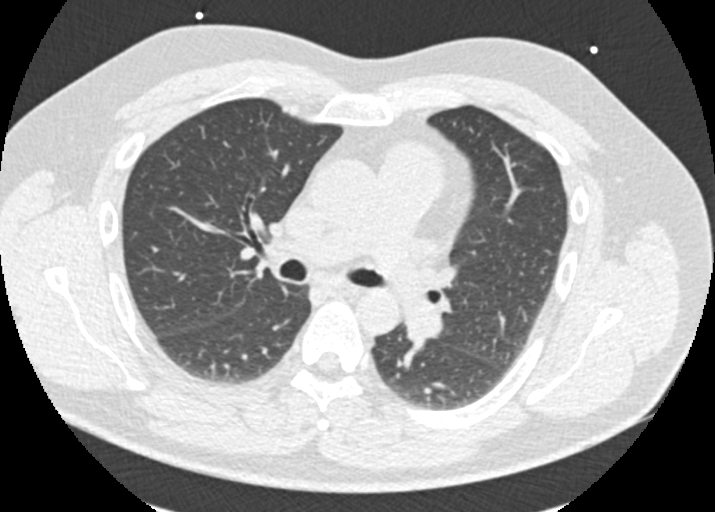

[Series 9: calcium scoring 2.00 br60 bestdiast 71% lungs · axial · 0.54mm/px · z∈[+1590,+1666]mm · 5 of 58 slices shown]
[im 10/58  vessel]
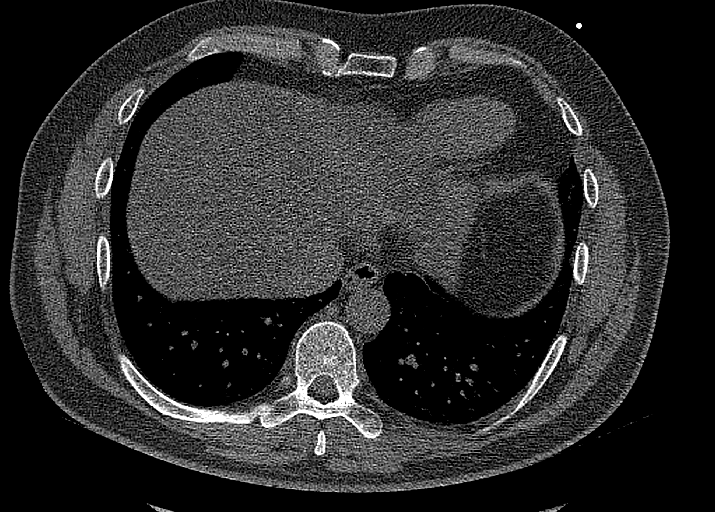
[im 20/58  vessel]
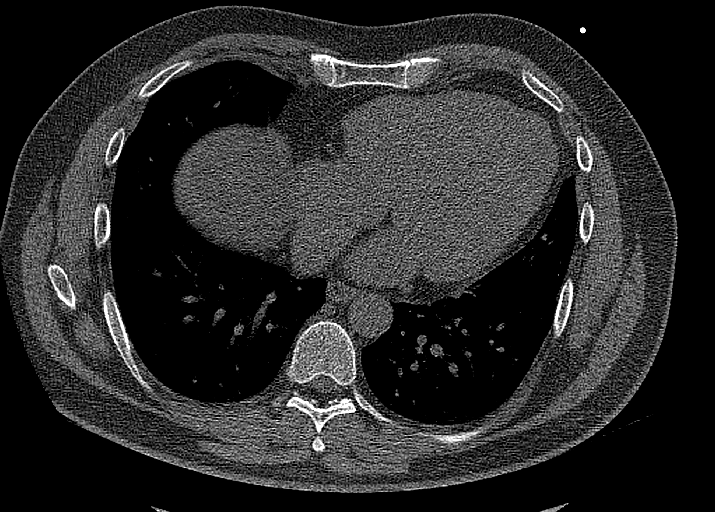
[im 29/58  vessel]
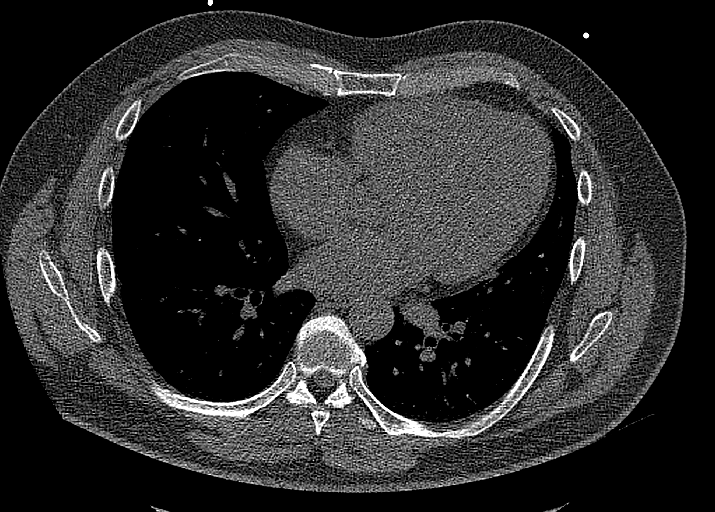
[im 39/58  vessel]
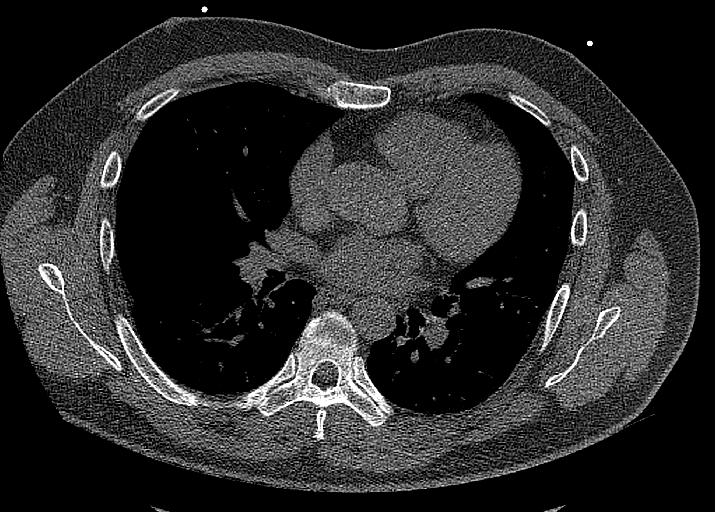
[im 48/58  vessel]
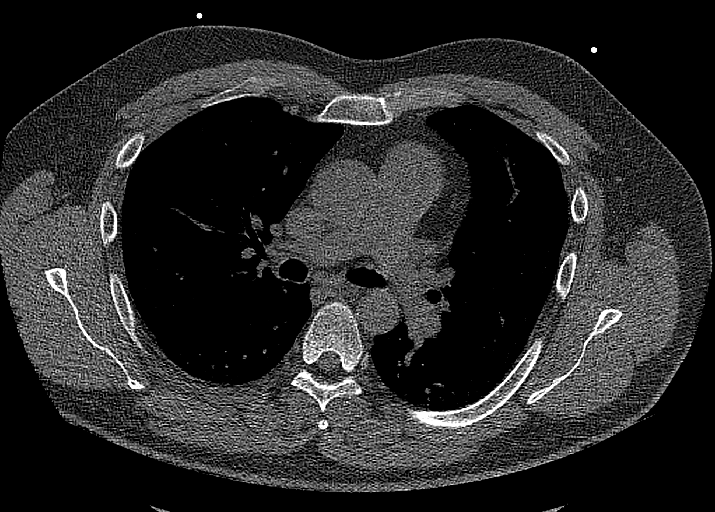

[13 of 20 positions shown; findings below may reference images not displayed]

FINDINGS: CORONARY CALCIUM SCORES:

Left Main: 0

LAD: 30

LCx: 0

RCA:

Total Agatston Score:

[HOSPITAL] percentile: 91

AORTA MEASUREMENTS:

Ascending Aorta: 36 mm

Descending Aorta: 23 mm

OTHER FINDINGS:

Heart size is within normal limits. Visualized mediastinal
structures are normal. Decreased attenuation in the visualized liver
is suggestive for hepatic steatosis. No airspace disease or
consolidation in the visualized lungs. No acute bone abnormality.
IMPRESSION: 1. Coronary calcium score is 70.1 and this is at percentile 91 for
patients of the same age, gender and ethnicity.
2. Hepatic steatosis.

## 2023-05-24 ENCOUNTER — Ambulatory Visit: Payer: No Typology Code available for payment source | Admitting: Pharmacist

## 2023-07-06 NOTE — Progress Notes (Signed)
HPI: Nathan Torres is a 47 y.o. male who presents to the [facility] pharmacy clinic for HIV PrEP follow-up.  Insured   [x]    Uninsured  []    Patient Active Problem List   Diagnosis Date Noted   Hyperlipidemia 12/22/2022   Elevated coronary artery calcium score 12/22/2022   High risk homosexual behavior 06/10/2020    Patient's Medications  New Prescriptions   No medications on file  Previous Medications   EMTRICITABINE-TENOFOVIR AF (DESCOVY) 200-25 MG TABLET    Take 1 tablet by mouth daily.   EVOLOCUMAB (REPATHA SURECLICK) 140 MG/ML SOAJ    Inject 140 mg into the skin every 14 (fourteen) days.   MULTIPLE VITAMIN (MULTIVITAMIN) TABLET    Take 1 tablet by mouth daily.   WHEAT DEXTRIN (BENEFIBER PO)    Take by mouth.  Modified Medications   No medications on file  Discontinued Medications   No medications on file       03/24/2020   11:46 AM  CH[facility] HIV PREP FLOWSHEET RESULTS  Insurance Status Insured  Gender at birth Male  Gender identity cis-Male  Risk for HIV Hx of STI  Sex Partners Men only  # sex partners past 3-6 mos 3  Sex activity preferences Insertive and receptive;Insertive;Oral  Condom use Yes  % condom use 75  Treated for STI? Yes  HIV symptoms? None    Labs:  SCr: Lab Results  Component Value Date   CREATININE 0.94 08/30/2022   CREATININE 0.98 10/23/2020   CREATININE 0.98 08/25/2020   HIV Lab Results  Component Value Date   HIV NON-REACTIVE 02/22/2023   HIV NON-REACTIVE 11/23/2022   HIV NON-REACTIVE 08/30/2022   HIV NON-REACTIVE 07/28/2021   HIV NON-REACTIVE 04/28/2021   Hepatitis B Lab Results  Component Value Date   HEPBSAB NON-REACTIVE 10/23/2020   HEPBSAG NON-REACTIVE 10/23/2020   HEPBCAB NON-REACTIVE 10/23/2020   Hepatitis C Lab Results  Component Value Date   HEPCAB NON-REACTIVE 10/23/2020   Hepatitis A Lab Results  Component Value Date   HAV REACTIVE (A) 08/30/2022   RPR and STI Lab Results  Component Value Date   LABRPR  NON-REACTIVE 02/22/2023   LABRPR NON-REACTIVE 11/23/2022   LABRPR NON-REACTIVE 01/23/2021   LABRPR NON-REACTIVE 10/23/2020   LABRPR NON-REACTIVE 08/25/2020    STI Results GC CT  02/22/2023 11:28 AM Negative    Negative    Negative  Negative    Negative    Negative   11/23/2022  2:38 PM Negative    Negative    Negative  Negative    Negative    Negative   04/28/2021 11:38 AM Negative    Negative    Negative  Negative    Negative    Negative   01/23/2021 10:23 AM Negative    Negative    Negative  Negative    Negative    Negative   10/23/2020  2:24 PM Negative    Negative    Negative  Negative    Negative    Negative   08/25/2020  2:10 PM Negative    Negative    Negative  Negative    Negative    Negative   06/09/2020 11:39 AM Negative    Negative    Negative  Negative    Negative    Negative     Assessment: [name] presents today for follow up on Descovy for PrEP therapy. He reports that his father passed away, and he will be taking care of his mother. At  this time, PrEP is not right for him, as he is not sexually active. He reports last sexual encounter in August, and he stopped Descovy on 06/16/23. He does requests STI testing today. Note that he was started on Repatha earlier this year due to LDL of 151, elevated CAC score, and family history of heart disease. Most recent LDL was 31 on 03/02/23. Discussed increased risk of higher cholesterol with Descovy, however Repatha appears to be doing a great job at controlling his cholesterol. If restarting PrEP, Descovy should still be a safe option, we will monitor his lipids. [name] will follow up if he decides to restart PrEP in the future.  Our records indicate he is for influenza and COVID vaccines today, however, he reports receiving this at a local pharmacy this season.  Plan: - Stop Descovy - STI testing: HIV ab, RPR, oral/rectal/urine cytologies for gonorrhea/chlamydia - Call any time to set up a new appointment if  needed  Lora Paula, PharmD PGY-2 Infectious Diseases Pharmacy Resident [facility] 07/07/2023 2:11 PM

## 2023-07-07 ENCOUNTER — Other Ambulatory Visit (HOSPITAL_COMMUNITY)
Admission: RE | Admit: 2023-07-07 | Discharge: 2023-07-07 | Disposition: A | Discharge status: Home / Self Care | Payer: No Typology Code available for payment source | Source: Ambulatory Visit | Attending: Infectious Disease | Admitting: Infectious Disease

## 2023-07-07 ENCOUNTER — Ambulatory Visit (INDEPENDENT_AMBULATORY_CARE_PROVIDER_SITE_OTHER): Payer: No Typology Code available for payment source | Admitting: Pharmacist

## 2023-07-07 ENCOUNTER — Other Ambulatory Visit: Payer: Self-pay

## 2023-07-07 DIAGNOSIS — Z113 Encounter for screening for infections with a predominantly sexual mode of transmission: Secondary | ICD-10-CM | POA: Diagnosis present

## 2023-07-07 DIAGNOSIS — Z2981 Encounter for HIV pre-exposure prophylaxis: Secondary | ICD-10-CM

## 2023-07-08 LAB — URINE CYTOLOGY ANCILLARY ONLY
Chlamydia: NEGATIVE
Comment: NEGATIVE
Comment: NORMAL
Neisseria Gonorrhea: NEGATIVE

## 2023-07-08 LAB — CYTOLOGY, (ORAL, ANAL, URETHRAL) ANCILLARY ONLY
Chlamydia: NEGATIVE
Chlamydia: NEGATIVE
Comment: NEGATIVE
Comment: NEGATIVE
Comment: NORMAL
Comment: NORMAL
Neisseria Gonorrhea: NEGATIVE
Neisseria Gonorrhea: NEGATIVE

## 2023-07-08 LAB — RPR: RPR Ser Ql: NONREACTIVE

## 2023-07-08 LAB — HIV ANTIBODY (ROUTINE TESTING W REFLEX): HIV 1&2 Ab, 4th Generation: NONREACTIVE

## 2023-11-01 ENCOUNTER — Encounter: Payer: Self-pay | Admitting: Cardiovascular Disease

## 2023-11-01 ENCOUNTER — Ambulatory Visit: Attending: Cardiovascular Disease | Admitting: Cardiovascular Disease

## 2023-11-01 VITALS — BP 130/89 | HR 66 | Ht 66.0 in | Wt 201.0 lb

## 2023-11-01 DIAGNOSIS — E782 Mixed hyperlipidemia: Secondary | ICD-10-CM

## 2023-11-01 NOTE — Progress Notes (Signed)
 Cardiology Office Note:    Date:  11/01/2023   ID:  Nathan Torres, DOB 06-27-1976, MRN [id number]  PCP:  Georgianne Fick, MD   [facility] Providers Cardiologist:  Justan Gaede   }    Referring MD: Georgianne Fick, MD   No chief complaint on file.    History of Present Illness:    Nathan Torres is a 48 y.o. male with a hx of  coronary artery calcifications and a family history of coronary and carotid disease.  Coronary calcium score is 70.1 and this is at percentile 91 for patients of the same age, gender and ethnicity.  Rest symptoms see him today for further evaluation. Has GERD symptoms  Exercises 4 days a week ( starting in Jan)  No CP  Elliptical machine. Gets his HR up to 180s  Works - Conservator, museum/gallery for life insurance Automotive engineer )   Labs from his primary medical doctor's office from Dec 14, 2021 reveals hemoglobin of 14.8.  White blood cell count of 6.5.  Liver enzymes-ALT is 51 which is in normal range.  Creatinine is 1.08.  Potassium is 3.9.  Sodium is 139.  Cholesterol level is 190.  HDL is 42.  LDL is 119.  Triglyceride levels 145.   November 01, 2023:   Nathan Torres is  seen today for follow-up visit.  I saw his father before he passed away while cycling .   He has a history of coronary artery calcifications.   Coronary calcium score is 70.1 which places him in the 91st percentile for age and sex matched controls.     Last lipids were drawn in March 02, 2023.  His last LDL is 31. LP(a) is 9.6 ( normal )   Is having some palpitations when he lies down at night  not associated with any CP or dyspnea  Getting some regular exercise          Past Medical History:  Diagnosis Date   Bloody stools    Per [facility] encounter 03/13/2020   Hemorrhoids    Per [facility] encounter 03/13/2020    Past Surgical History:  Procedure Laterality Date   vocal cords     WISDOM TOOTH EXTRACTION      Current Medications: Current Meds  Medication Sig    Evolocumab (REPATHA SURECLICK) 140 MG/ML SOAJ Inject 140 mg into the skin every 14 (fourteen) days.   Multiple Vitamin (MULTIVITAMIN) tablet Take 1 tablet by mouth daily.   Wheat Dextrin (BENEFIBER PO) Take by mouth.     Allergies:   Atorvastatin and Rosuvastatin   Social History   Socioeconomic History   Marital status: Married    Spouse name: Not on file   Number of children: Not on file   Years of education: Not on file   Highest education level: Not on file  Occupational History   Occupation: life Marine scientist    Employer: [employer]  Tobacco Use   Smoking status: Never   Smokeless tobacco: Never  Vaping Use   Vaping status: Never Used  Substance and Sexual Activity   Alcohol use: Yes    Comment: occasional   Drug use: Never   Sexual activity: Not on file  Other Topics Concern   Not on file  Social History Narrative   Not on file   Social Drivers of Health   Financial Resource Strain: Not on file  Food Insecurity: Not on file  Transportation Needs: Not on file  Physical Activity: Not on  file  Stress: Not on file  Social Connections: Not on file     Family History: The patient's family history includes Heart disease in his mother; Hypertension in his brother, father, and mother; Thyroid disease in his mother. There is no history of Stomach cancer, Colon cancer, Esophageal cancer, Pancreatic cancer, or Rectal cancer.  ROS:   Please see the history of present illness.     All other systems reviewed and are negative.  EKGs/Labs/Other Studies Reviewed:    The following studies were reviewed today:   EKG:    EKG Interpretation Date/Time:  Tuesday November 01 2023 10:46:36 EDT Ventricular Rate:  59 PR Interval:  166 QRS Duration:  84 QT Interval:  378 QTC Calculation: 374 R Axis:   34  Text Interpretation: Sinus bradycardia No previous ECGs available Confirmed by Kristeen Miss ([id number]) on 11/01/2023 11:04:46 AM    Recent Labs: 03/02/2023:  ALT 43  Recent Lipid Panel    Component Value Date/Time   CHOL 98 (L) 03/02/2023 0916   TRIG 139 03/02/2023 0916   HDL 43 03/02/2023 0916   CHOLHDL 2.3 03/02/2023 0916   LDLCALC 31 03/02/2023 0916     Risk Assessment/Calculations:           Physical Exam:    Physical Exam: Blood pressure 130/89, pulse 66, height 5\' 6"  (1.676 m), weight 201 lb (91.2 kg), SpO2 96%.       GEN:  Well nourished, well developed in no acute distress HEENT: Normal NECK: No JVD; No carotid bruits LYMPHATICS: No lymphadenopathy CARDIAC: RRR , no murmurs, rubs, gallops RESPIRATORY:  Clear to auscultation without rales, wheezing or rhonchi  ABDOMEN: Soft, non-tender, non-distended MUSCULOSKELETAL:  No edema; No deformity  SKIN: Warm and dry NEUROLOGIC:  Alert and oriented x 3   ASSESSMENT:    1. Mixed hyperlipidemia    PLAN:       Coronary calcium :       Coronary calcium score is 70.1 which places him in the 91st percentile for age and sex matched controls.     Last lipids were drawn in March 02, 2023.  His last LDL is 31. LP(a) is 9.6 ( normal ) Continue same meds.  Recheck lipids today .            Medication Adjustments/Labs and Tests Ordered: Current medicines are reviewed at length with the patient today.  Concerns regarding medicines are outlined above.  Orders Placed This Encounter  Procedures   Lipid panel   ALT   Basic metabolic panel   EKG 12-Lead   No orders of the defined types were placed in this encounter.    Patient Instructions  Medication Instructions:  Your physician recommends that you continue on your current medications as directed. Please refer to the Current Medication list given to you today.  *If you need a refill on your cardiac medications before your next appointment, please call your pharmacy*  Lab Work: TODAY: Lipid panel, ALT, BMP If you have labs (blood work) drawn today and your tests are completely normal, you will receive your results  only by: MyChart Message (if you have MyChart) OR A paper copy in the mail If you have any lab test that is abnormal or we need to change your treatment, we will call you to review the results.  Follow-Up: At [facility], you and your health needs are our priority.  As part of our continuing mission to provide you with exceptional heart care, we have created  designated Provider Care Teams.  These Care Teams include your primary Cardiologist (physician) and Advanced Practice Providers (APPs -  Physician Assistants and Nurse Practitioners) who all work together to provide you with the care you need, when you need it.  Your next appointment:   1 year(s)  The format for your next appointment:   In Person  Provider:   Kristeen Miss, MD  Other Instructions    1st Floor: - Lobby - Registration  - Pharmacy  - Lab - Cafe  2nd Floor: - PV Lab - Diagnostic Testing (echo, CT, nuclear med)  3rd Floor: - Vacant  4th Floor: - TCTS (cardiothoracic surgery) - AFib Clinic - Structural Heart Clinic - Vascular Surgery  - Vascular Ultrasound  5th Floor: - HeartCare Cardiology (general and EP) - Clinical Pharmacy for coumadin, hypertension, lipid, weight-loss medications, and med management appointments    Valet parking services will be available as well.     Signed, Kristeen Miss, MD  11/01/2023 4:57 PM    [facility]

## 2023-11-01 NOTE — Patient Instructions (Signed)
 Medication Instructions:  Your physician recommends that you continue on your current medications as directed. Please refer to the Current Medication list given to you today.  *If you need a refill on your cardiac medications before your next appointment, please call your pharmacy*  Lab Work: TODAY: Lipid panel, ALT, BMP If you have labs (blood work) drawn today and your tests are completely normal, you will receive your results only by: MyChart Message (if you have MyChart) OR A paper copy in the mail If you have any lab test that is abnormal or we need to change your treatment, we will call you to review the results.  Follow-Up: At [facility], you and your health needs are our priority.  As part of our continuing mission to provide you with exceptional heart care, we have created designated Provider Care Teams.  These Care Teams include your primary Cardiologist (physician) and Advanced Practice Providers (APPs -  Physician Assistants and Nurse Practitioners) who all work together to provide you with the care you need, when you need it.  Your next appointment:   1 year(s)  The format for your next appointment:   In Person  Provider:   Kristeen Miss, MD  Other Instructions    1st Floor: - Lobby - Registration  - Pharmacy  - Lab - Cafe  2nd Floor: - PV Lab - Diagnostic Testing (echo, CT, nuclear med)  3rd Floor: - Vacant  4th Floor: - TCTS (cardiothoracic surgery) - AFib Clinic - Structural Heart Clinic - Vascular Surgery  - Vascular Ultrasound  5th Floor: - HeartCare Cardiology (general and EP) - Clinical Pharmacy for coumadin, hypertension, lipid, weight-loss medications, and med management appointments    Valet parking services will be available as well.

## 2023-11-03 ENCOUNTER — Encounter: Payer: Self-pay | Admitting: Cardiovascular Disease

## 2023-11-04 LAB — ALT: ALT: 46 IU/L — ABNORMAL HIGH (ref 0–44)

## 2023-11-04 LAB — BASIC METABOLIC PANEL
BUN/Creatinine Ratio: 13 (ref 9–20)
BUN: 13 mg/dL (ref 6–24)
CO2: 25 mmol/L (ref 20–29)
Calcium: 9.7 mg/dL (ref 8.7–10.2)
Chloride: 103 mmol/L (ref 96–106)
Creatinine, Ser: 0.97 mg/dL (ref 0.76–1.27)
Glucose: 98 mg/dL (ref 70–99)
Potassium: 4.5 mmol/L (ref 3.5–5.2)
Sodium: 141 mmol/L (ref 134–144)
eGFR: 96 mL/min/{1.73_m2} (ref >59)

## 2023-11-04 LAB — LIPID PANEL
Chol/HDL Ratio: 2.6 ratio (ref 0.0–5.0)
Cholesterol, Total: 106 mg/dL (ref 100–199)
HDL: 41 mg/dL (ref >39)
LDL Chol Calc (NIH): 38 mg/dL (ref 0–99)
Triglycerides: 158 mg/dL — ABNORMAL HIGH (ref 0–149)
VLDL Cholesterol Cal: 27 mg/dL (ref 5–40)

## 2023-11-09 ENCOUNTER — Other Ambulatory Visit: Payer: Self-pay | Admitting: Cardiovascular Disease

## 2023-11-09 DIAGNOSIS — Z8249 Family history of ischemic heart disease and other diseases of the circulatory system: Secondary | ICD-10-CM

## 2023-11-09 DIAGNOSIS — E782 Mixed hyperlipidemia: Secondary | ICD-10-CM

## 2023-11-09 DIAGNOSIS — R931 Abnormal findings on diagnostic imaging of heart and coronary circulation: Secondary | ICD-10-CM

## 2023-12-26 LAB — LAB REPORT - SCANNED
A1c: 5.6
EGFR: 87

## 2024-01-03 ENCOUNTER — Ambulatory Visit: Payer: Self-pay | Admitting: Internal Medicine

## 2024-04-07 NOTE — Progress Notes (Unsigned)
 HPI: Nathan Torres is a 48 y.o. male who presents to the [facility] pharmacy clinic to discuss and restart PrEP.  Insured   [x]    Uninsured  []    Patient Active Problem List   Diagnosis Date Noted   Hyperlipidemia 12/22/2022   Elevated coronary artery calcium  score 12/22/2022   High risk homosexual behavior 06/10/2020    Patient's Medications  New Prescriptions   No medications on file  Previous Medications   EMTRICITABINE -TENOFOVIR  AF (DESCOVY ) 200-25 MG TABLET    Take 1 tablet by mouth daily.   EVOLOCUMAB  (REPATHA  SURECLICK) 140 MG/ML SOAJ    INJECT 140 MG INTO THE SKIN EVERY 14 (FOURTEEN) DAYS.   MULTIPLE VITAMIN (MULTIVITAMIN) TABLET    Take 1 tablet by mouth daily.   WHEAT DEXTRIN (BENEFIBER PO)    Take by mouth.  Modified Medications   No medications on file  Discontinued Medications   No medications on file       04/09/2024   10:18 AM 03/24/2020   11:46 AM  CHL HIV PREP FLOWSHEET RESULTS  Insurance Status  Insured  Gender at birth Male Male  Gender identity  cis-Male  Risk for HIV Hx of STI Hx of STI  Sex Partners Men only Men only  # sex partners past 3-6 mos 3 3  Sex activity preferences Insertive and receptive;Oral Insertive and receptive;Insertive;Oral  Condom use Yes Yes  % condom use  75  Treated for STI? No Yes  HIV symptoms?  None  PrEP Eligibility Yes     Labs:  SCr: Lab Results  Component Value Date   CREATININE 0.97 11/01/2023   CREATININE 0.94 08/30/2022   CREATININE 0.98 10/23/2020   CREATININE 0.98 08/25/2020   HIV Lab Results  Component Value Date   HIV NON-REACTIVE 07/07/2023   HIV NON-REACTIVE 02/22/2023   HIV NON-REACTIVE 11/23/2022   HIV NON-REACTIVE 08/30/2022   HIV NON-REACTIVE 07/28/2021   Hepatitis B Lab Results  Component Value Date   HEPBSAB NON-REACTIVE 10/23/2020   HEPBSAG NON-REACTIVE 10/23/2020   HEPBCAB NON-REACTIVE 10/23/2020   Hepatitis C Lab Results  Component Value Date   HEPCAB NON-REACTIVE 10/23/2020    Hepatitis A Lab Results  Component Value Date   HAV REACTIVE (A) 08/30/2022   RPR and STI Lab Results  Component Value Date   LABRPR NON-REACTIVE 07/07/2023   LABRPR NON-REACTIVE 02/22/2023   LABRPR NON-REACTIVE 11/23/2022   LABRPR NON-REACTIVE 01/23/2021   LABRPR NON-REACTIVE 10/23/2020    STI Results GC CT  07/07/2023  2:19 PM Negative    Negative    Negative  Negative    Negative    Negative   02/22/2023 11:28 AM Negative    Negative    Negative  Negative    Negative    Negative   11/23/2022  2:38 PM Negative    Negative    Negative  Negative    Negative    Negative   04/28/2021 11:38 AM Negative    Negative    Negative  Negative    Negative    Negative   01/23/2021 10:23 AM Negative    Negative    Negative  Negative    Negative    Negative   10/23/2020  2:24 PM Negative    Negative    Negative  Negative    Negative    Negative   08/25/2020  2:10 PM Negative    Negative    Negative  Negative    Negative    Negative  06/09/2020 11:39 AM Negative    Negative    Negative  Negative    Negative    Negative     Assessment: Nathan Torres presents today for PrEP restart. He was last seen 07/07/23 and was on Descovy  at the time. Today, we discussed oral and injectable options for PrEP. He expressed interest in Apretude, but would like to defer for now until his schedule has more availability. He would like to restart Descovy . Will check an HIV antibody prior to restarting Descovy . He agrees to STI testing today. We discussed he is eligible for both the shingles and pneumococcal vaccine which he would like to defer today.   Number of partners in last 3 months: 3 Partner preference: men only Sexual practices: insertive, receptive, oral  Use of prevention/protection: condoms  STD history: one infection years ago History of IVDU? no History of PEP? no  Plan: - Check HIV Antibody - STI testing - Follow up 07/10/24  Nathan Torres, PharmD PGY1 Clinical  Pharmacist [facility]  04/09/2024 10:28 AM

## 2024-04-09 ENCOUNTER — Ambulatory Visit (INDEPENDENT_AMBULATORY_CARE_PROVIDER_SITE_OTHER): Admitting: Pharmacist

## 2024-04-09 ENCOUNTER — Other Ambulatory Visit: Payer: Self-pay

## 2024-04-09 ENCOUNTER — Other Ambulatory Visit (HOSPITAL_COMMUNITY)
Admission: RE | Admit: 2024-04-09 | Discharge: 2024-04-09 | Disposition: A | Discharge status: Home / Self Care | Source: Ambulatory Visit | Attending: Infectious Disease | Admitting: Infectious Disease

## 2024-04-09 DIAGNOSIS — Z113 Encounter for screening for infections with a predominantly sexual mode of transmission: Secondary | ICD-10-CM

## 2024-04-09 DIAGNOSIS — Z2981 Encounter for HIV pre-exposure prophylaxis: Secondary | ICD-10-CM | POA: Diagnosis not present

## 2024-04-10 LAB — RPR: RPR Ser Ql: NONREACTIVE

## 2024-04-10 LAB — URINE CYTOLOGY ANCILLARY ONLY
Chlamydia: NEGATIVE
Comment: NEGATIVE
Comment: NORMAL
Neisseria Gonorrhea: NEGATIVE

## 2024-04-10 LAB — CYTOLOGY, (ORAL, ANAL, URETHRAL) ANCILLARY ONLY
Chlamydia: NEGATIVE
Chlamydia: NEGATIVE
Comment: NEGATIVE
Comment: NEGATIVE
Comment: NORMAL
Comment: NORMAL
Neisseria Gonorrhea: NEGATIVE
Neisseria Gonorrhea: NEGATIVE

## 2024-04-10 LAB — HIV ANTIBODY (ROUTINE TESTING W REFLEX): HIV 1&2 Ab, 4th Generation: NONREACTIVE

## 2024-04-11 ENCOUNTER — Other Ambulatory Visit: Payer: Self-pay

## 2024-04-11 DIAGNOSIS — Z79899 Other long term (current) drug therapy: Secondary | ICD-10-CM

## 2024-04-11 MED ORDER — DESCOVY 200-25 MG PO TABS
1.0000 | ORAL_TABLET | Freq: Every day | ORAL | 2 refills | Status: DC
Start: 2024-04-11 — End: 2024-07-06

## 2024-05-02 ENCOUNTER — Telehealth: Payer: Self-pay | Admitting: Cardiovascular Disease

## 2024-05-02 DIAGNOSIS — E782 Mixed hyperlipidemia: Secondary | ICD-10-CM

## 2024-05-02 DIAGNOSIS — Z8249 Family history of ischemic heart disease and other diseases of the circulatory system: Secondary | ICD-10-CM

## 2024-05-02 DIAGNOSIS — R931 Abnormal findings on diagnostic imaging of heart and coronary circulation: Secondary | ICD-10-CM

## 2024-05-02 MED ORDER — REPATHA SURECLICK 140 MG/ML ~~LOC~~ SOAJ: 140.0000 mg | SUBCUTANEOUS | 1 refills | Status: DC

## 2024-05-02 NOTE — Telephone Encounter (Signed)
*  STAT* If patient is at the pharmacy, call can be transferred to refill team.   1. Which medications need to be refilled? (please list name of each medication and dose if known) Evolocumab  (REPATHA  SURECLICK) 140 MG/ML SOAJ    2. Would you like to learn more about the convenience, safety, & potential cost savings by using the [facility]?    3. Are you open to using the [facility] (Type [facility].  ).   4. Which pharmacy/location (including street and city if local pharmacy) is medication to be sent to? [id number] [place] - [address] [facility] [address]    5. Do they need a 30 day or 90 day supply? 90 day

## 2024-06-13 ENCOUNTER — Other Ambulatory Visit: Payer: Self-pay | Admitting: Pharmacist

## 2024-06-13 DIAGNOSIS — Z79899 Other long term (current) drug therapy: Secondary | ICD-10-CM

## 2024-06-13 NOTE — Telephone Encounter (Signed)
 Prep patient

## 2024-07-03 NOTE — Progress Notes (Unsigned)
 HPI: Nathan Torres is a 48 y.o. male who presents to the [facility] pharmacy clinic for HIV PrEP follow-up.  Referring ID Physician: Dr. Fleeta Rothman   Patient Active Problem List   Diagnosis Date Noted   Hyperlipidemia 12/22/2022   Elevated coronary artery calcium  score 12/22/2022   High risk homosexual behavior 06/10/2020    Patient's Medications  New Prescriptions   No medications on file  Previous Medications   EMTRICITABINE -TENOFOVIR  AF (DESCOVY ) 200-25 MG TABLET    Take 1 tablet by mouth daily.   EVOLOCUMAB  (REPATHA  SURECLICK) 140 MG/ML SOAJ    Inject 140 mg into the skin every 14 (fourteen) days.   MULTIPLE VITAMIN (MULTIVITAMIN) TABLET    Take 1 tablet by mouth daily.   WHEAT DEXTRIN (BENEFIBER PO)    Take by mouth.  Modified Medications   No medications on file  Discontinued Medications   No medications on file       04/09/2024   10:18 AM 03/24/2020   11:46 AM  CHL HIV PREP FLOWSHEET RESULTS  Insurance Status  Insured  Gender at birth Male Male  Gender identity  cis-Male  Risk for HIV Hx of STI Hx of STI  Sex Partners Men only Men only  # sex partners past 3-6 mos 3 3  Sex activity preferences Insertive and receptive;Oral Insertive and receptive;Insertive;Oral  Condom use Yes Yes  % condom use  75  Treated for STI? No Yes  HIV symptoms?  None  PrEP Eligibility Yes     Labs:  SCr: Lab Results  Component Value Date   CREATININE 0.97 11/01/2023   CREATININE 0.94 08/30/2022   CREATININE 0.98 10/23/2020   CREATININE 0.98 08/25/2020   HIV Lab Results  Component Value Date   HIV NON-REACTIVE 04/09/2024   HIV NON-REACTIVE 07/07/2023   HIV NON-REACTIVE 02/22/2023   HIV NON-REACTIVE 11/23/2022   HIV NON-REACTIVE 08/30/2022   Hepatitis B Lab Results  Component Value Date   HEPBSAB NON-REACTIVE 10/23/2020   HEPBSAG NON-REACTIVE 10/23/2020   HEPBCAB NON-REACTIVE 10/23/2020   Hepatitis C Lab Results  Component Value Date   HEPCAB NON-REACTIVE  10/23/2020   Hepatitis A Lab Results  Component Value Date   HAV REACTIVE (A) 08/30/2022   RPR and STI Lab Results  Component Value Date   LABRPR NON-REACTIVE 04/09/2024   LABRPR NON-REACTIVE 07/07/2023   LABRPR NON-REACTIVE 02/22/2023   LABRPR NON-REACTIVE 11/23/2022   LABRPR NON-REACTIVE 01/23/2021    STI Results GC CT  04/09/2024 10:24 AM Negative    Negative    Negative  Negative    Negative    Negative   07/07/2023  2:19 PM Negative    Negative    Negative  Negative    Negative    Negative   02/22/2023 11:28 AM Negative    Negative    Negative  Negative    Negative    Negative   11/23/2022  2:38 PM Negative    Negative    Negative  Negative    Negative    Negative   04/28/2021 11:38 AM Negative    Negative    Negative  Negative    Negative    Negative   01/23/2021 10:23 AM Negative    Negative    Negative  Negative    Negative    Negative   10/23/2020  2:24 PM Negative    Negative    Negative  Negative    Negative    Negative   08/25/2020  2:10  PM Negative    Negative    Negative  Negative    Negative    Negative   06/09/2020 11:39 AM Negative    Negative    Negative  Negative    Negative    Negative     Assessment: Nathan Torres is here today for HIV PrEP follow up. Reports doing well on Descovy  without any issues. Reports no missed doses. Patient denies experiencing acute HIV symptoms (e.g., fever, night sweats, fatigue, muscle aches, rash, sore throat, lymphadenopathy, headache, nausea/vomiting/diarrhea). Patient agrees to STI testing today.   Discussed Apretude in which patient is not interested due to the frequency of every 2 month injections. Discussed lenacapavir [place]) for PrEP. Counseled patient that they will need to complete an oral loading dose with initial injections. Counseled that patient will take two lenacapavir 300 mg tablets (600 mg total) orally on the first day of their injection and will take two lenacapavir 300 mg tablets  (600 mg total) orally the next day regardless of meals. Counseled patient that lenacapavir is two separate subcutaneous injections in the abdomen every 6 months. Reviewed that the main side effects are injection-site soreness and nodules. Discussed measures for relief including cold packs/heat packs and over-the-counter pain relievers (such as acetaminophen or ibuprofen). Patient is interested in [place] and would like to see if this would be covered.   Labs: - Last HIV ab was negative on 04/09/2024 - HIV antibody, oral/urine/rectal cytologies for GC/Chlamydia, RPR   Eligible vaccinations: Influenza, Covid, Shingrix 1/2, PCV20  - Patient states receiving the influenza vaccine in October from [facility] and will work on sending in a ecologist for our records - Defers all other vaccines today and will consider Shingrix during next visit  Plan: - Check HIV antibody, oral/urine/rectal cytologies for GC/Chlamydia, RPR  - Tentatively send prescription refills for Descovy  pending negative HIV antibody result  - Follow up with [place] and Charmaine regarding [employer] approval  - Follow up on 10/02/2024 with Alan Feliciano Close, PharmD PGY2 Infectious Diseases Pharmacy Resident

## 2024-07-04 ENCOUNTER — Ambulatory Visit (INDEPENDENT_AMBULATORY_CARE_PROVIDER_SITE_OTHER): Admitting: Pharmacist

## 2024-07-04 ENCOUNTER — Other Ambulatory Visit: Payer: Self-pay

## 2024-07-04 ENCOUNTER — Other Ambulatory Visit (HOSPITAL_COMMUNITY)
Admission: RE | Admit: 2024-07-04 | Discharge: 2024-07-04 | Disposition: A | Discharge status: Home / Self Care | Source: Ambulatory Visit | Attending: Infectious Disease | Admitting: Infectious Disease

## 2024-07-04 DIAGNOSIS — Z79899 Other long term (current) drug therapy: Secondary | ICD-10-CM | POA: Diagnosis not present

## 2024-07-04 DIAGNOSIS — Z113 Encounter for screening for infections with a predominantly sexual mode of transmission: Secondary | ICD-10-CM | POA: Insufficient documentation

## 2024-07-04 DIAGNOSIS — Z0000 Encounter for general adult medical examination without abnormal findings: Secondary | ICD-10-CM | POA: Diagnosis not present

## 2024-07-05 ENCOUNTER — Other Ambulatory Visit (HOSPITAL_COMMUNITY): Payer: Self-pay

## 2024-07-05 LAB — CYTOLOGY, (ORAL, ANAL, URETHRAL) ANCILLARY ONLY
Chlamydia: NEGATIVE
Chlamydia: NEGATIVE
Comment: NEGATIVE
Comment: NEGATIVE
Comment: NORMAL
Comment: NORMAL
Neisseria Gonorrhea: NEGATIVE
Neisseria Gonorrhea: NEGATIVE

## 2024-07-05 LAB — URINE CYTOLOGY ANCILLARY ONLY
Chlamydia: NEGATIVE
Comment: NEGATIVE
Comment: NORMAL
Neisseria Gonorrhea: NEGATIVE

## 2024-07-06 ENCOUNTER — Other Ambulatory Visit: Payer: Self-pay

## 2024-07-06 ENCOUNTER — Telehealth: Payer: Self-pay

## 2024-07-06 ENCOUNTER — Other Ambulatory Visit (HOSPITAL_COMMUNITY): Payer: Self-pay

## 2024-07-06 DIAGNOSIS — Z79899 Other long term (current) drug therapy: Secondary | ICD-10-CM

## 2024-07-06 LAB — HIV ANTIBODY (ROUTINE TESTING W REFLEX)
HIV 1&2 Ab, 4th Generation: NONREACTIVE
HIV FINAL INTERPRETATION: NEGATIVE

## 2024-07-06 LAB — SYPHILIS: RPR W/REFLEX TO RPR TITER AND TREPONEMAL ANTIBODIES, TRADITIONAL SCREENING AND DIAGNOSIS ALGORITHM: RPR Ser Ql: NONREACTIVE

## 2024-07-06 MED ORDER — DESCOVY 200-25 MG PO TABS: 1.0000 | ORAL_TABLET | Freq: Every day | ORAL | 2 refills | Status: DC

## 2024-07-06 NOTE — Telephone Encounter (Signed)
 Submitted a Prior Authorization request to [facility] for [place] Tablets via Latent. Will update once we receive a response.    PA ID: [id number]

## 2024-07-09 ENCOUNTER — Telehealth: Payer: Self-pay

## 2024-07-09 NOTE — Telephone Encounter (Signed)
 Received a fax regarding Prior Authorization from [facility] for [facility] Authorization has been DENIED because Patient will need to try and fail 3 preferred drugs ([place], Apretude  & Descovy ) .   Phone# [phone]

## 2024-07-10 ENCOUNTER — Ambulatory Visit: Admitting: Pharmacist

## 2024-07-16 ENCOUNTER — Encounter: Payer: Self-pay | Admitting: Pharmacist

## 2024-07-22 ENCOUNTER — Other Ambulatory Visit: Payer: Self-pay | Admitting: Internal Medicine

## 2024-07-22 DIAGNOSIS — Z8249 Family history of ischemic heart disease and other diseases of the circulatory system: Secondary | ICD-10-CM

## 2024-07-22 DIAGNOSIS — E782 Mixed hyperlipidemia: Secondary | ICD-10-CM

## 2024-07-22 DIAGNOSIS — R931 Abnormal findings on diagnostic imaging of heart and coronary circulation: Secondary | ICD-10-CM

## 2024-07-24 ENCOUNTER — Telehealth: Payer: Self-pay | Admitting: Pharmacy Technician

## 2024-07-24 ENCOUNTER — Other Ambulatory Visit (HOSPITAL_COMMUNITY): Payer: Self-pay

## 2024-07-24 NOTE — Telephone Encounter (Signed)
 Pharmacy Patient Advocate Encounter   Received notification from Physician's Office that prior authorization for Repatha  is required/requested.   Insurance verification completed.   The patient is insured through [facility] ADVANTAGE/RX ADVANCE.   Per test claim: The current 07/24/24 day co-pay is, $80.00- one month.  No PA needed at this time. This test claim was processed through [facility]- copay amounts may vary at other pharmacies due to pharmacy/plan contracts, or as the patient moves through the different stages of their insurance plan.

## 2024-07-30 ENCOUNTER — Telehealth: Payer: Self-pay

## 2024-07-31 NOTE — Telephone Encounter (Signed)
 Pharmacy Patient Advocate [place]counter[place] Laws BIV-Medical Benefit:  Tablets and Injection   J code: [id number], [id number]  CPT code: 03627  Dx Code: B20  No Prior Auth Needed through [facility].   $50 Office Visit with 100% coinsurance   Please send prescription to [facility] [phone] Estimated Patient cost is: $0

## 2024-08-23 ENCOUNTER — Other Ambulatory Visit (HOSPITAL_COMMUNITY): Payer: Self-pay

## 2024-08-23 ENCOUNTER — Telehealth: Payer: Self-pay | Admitting: Internal Medicine

## 2024-08-23 ENCOUNTER — Other Ambulatory Visit: Payer: Self-pay | Admitting: Pharmacist

## 2024-08-23 DIAGNOSIS — Z79899 Other long term (current) drug therapy: Secondary | ICD-10-CM

## 2024-08-23 MED ORDER — DESCOVY 200-25 MG PO TABS: 1.0000 | ORAL_TABLET | Freq: Every day | ORAL | 0 refills | Status: AC

## 2024-08-23 NOTE — Progress Notes (Signed)
 Patient requested last 30-day fill (of current 19-month prescription) be sent to local [facility]. He called his insurance who stated that he could fill through any pharmacy and did not have to fill [facility].  Alan Geralds, PharmD, CPP, BCIDP, AAHIVP Clinical Pharmacist Practitioner Infectious Diseases Clinical Pharmacist [facility]

## 2024-08-23 NOTE — Telephone Encounter (Signed)
 Pt c/o medication issue:  1. Name of Medication:   Evolocumab  (REPATHA  SURECLICK) 140 MG/ML SOAJ   2. How are you currently taking this medication (dosage and times per day)?   As prescribed  3. Are you having a reaction (difficulty breathing--STAT)?   4. What is your medication issue?   Patient stated his new insurance is requiring pre-authorization for this medication.  Patient stated he still has medication.  Patient stated his new insurance is [employer], phone# [phone], email: [email or web address]

## 2024-08-27 ENCOUNTER — Other Ambulatory Visit (HOSPITAL_COMMUNITY): Payer: Self-pay

## 2024-08-27 ENCOUNTER — Telehealth: Payer: Self-pay | Admitting: Pharmacy Technician

## 2024-08-27 NOTE — Telephone Encounter (Signed)
 Pharmacy Patient Advocate Encounter   Received notification from Pt Calls Messages that prior authorization for REPATHA  is required/requested.   Insurance verification completed.   The patient is insured through [facility].   Per test claim: PA required; PA started via CoverMyMeds. KEY [id number] . Waiting for clinical questions to populate.

## 2024-08-27 NOTE — Telephone Encounter (Signed)
" ° °  Sent message to patient to provide us  with rx info. No ins found in check "

## 2024-08-29 NOTE — Telephone Encounter (Signed)
 Pharmacy Patient Advocate Encounter   Received notification from Pt Calls Messages that prior authorization for repatha  is required/requested.   Insurance verification completed.   The patient is insured through [employer].   Per test claim: PA required; PA submitted to above mentioned insurance via Latent Key/confirmation #/EOC [id number] Status is pending

## 2024-09-03 NOTE — Telephone Encounter (Signed)
 Pharmacy Patient Advocate Encounter  Received notification from [employer] that Prior Authorization for repatha  has been APPROVED from 09/03/24 to 09/03/25   PA #/Case ID/Reference #: [id number]

## 2024-09-20 ENCOUNTER — Other Ambulatory Visit (HOSPITAL_COMMUNITY): Payer: Self-pay

## 2024-09-21 ENCOUNTER — Other Ambulatory Visit (HOSPITAL_COMMUNITY): Payer: Self-pay

## 2024-10-02 ENCOUNTER — Ambulatory Visit: Admitting: Pharmacist

## 2024-10-09 ENCOUNTER — Ambulatory Visit: Admitting: Pharmacist

## 2024-10-26 ENCOUNTER — Ambulatory Visit: Admitting: Internal Medicine
